# Patient Record
Sex: Male | Born: 1965 | Race: Asian | Hispanic: No | Marital: Married | Smoking: Former smoker
Health system: Southern US, Community
[De-identification: ages and names within clinical notes are randomized; demographics above are authoritative.]

## PROBLEM LIST (undated history)

## (undated) DIAGNOSIS — E7800 Pure hypercholesterolemia, unspecified: Secondary | ICD-10-CM

## (undated) HISTORY — PX: NASAL SEPTUM SURGERY: SHX37

---

## 2007-07-23 ENCOUNTER — Encounter: Admission: RE | Admit: 2007-07-23 | Discharge: 2007-07-23 | Payer: Self-pay | Admitting: Otolaryngology

## 2015-01-08 ENCOUNTER — Institutional Professional Consult (permissible substitution): Payer: Self-pay | Admitting: Internal Medicine

## 2018-01-31 DIAGNOSIS — E7800 Pure hypercholesterolemia, unspecified: Secondary | ICD-10-CM | POA: Diagnosis not present

## 2018-01-31 DIAGNOSIS — R0989 Other specified symptoms and signs involving the circulatory and respiratory systems: Secondary | ICD-10-CM | POA: Diagnosis not present

## 2018-01-31 DIAGNOSIS — R062 Wheezing: Secondary | ICD-10-CM | POA: Diagnosis not present

## 2018-01-31 DIAGNOSIS — R739 Hyperglycemia, unspecified: Secondary | ICD-10-CM | POA: Diagnosis not present

## 2018-02-22 DIAGNOSIS — Z0000 Encounter for general adult medical examination without abnormal findings: Secondary | ICD-10-CM | POA: Diagnosis not present

## 2018-02-22 DIAGNOSIS — Z125 Encounter for screening for malignant neoplasm of prostate: Secondary | ICD-10-CM | POA: Diagnosis not present

## 2018-02-22 DIAGNOSIS — R739 Hyperglycemia, unspecified: Secondary | ICD-10-CM | POA: Diagnosis not present

## 2018-02-22 DIAGNOSIS — E7800 Pure hypercholesterolemia, unspecified: Secondary | ICD-10-CM | POA: Diagnosis not present

## 2018-03-22 DIAGNOSIS — R065 Mouth breathing: Secondary | ICD-10-CM | POA: Diagnosis not present

## 2018-03-22 DIAGNOSIS — R0989 Other specified symptoms and signs involving the circulatory and respiratory systems: Secondary | ICD-10-CM | POA: Diagnosis not present

## 2018-03-22 DIAGNOSIS — J209 Acute bronchitis, unspecified: Secondary | ICD-10-CM | POA: Diagnosis not present

## 2018-03-22 DIAGNOSIS — R0981 Nasal congestion: Secondary | ICD-10-CM | POA: Diagnosis not present

## 2018-03-23 DIAGNOSIS — R0981 Nasal congestion: Secondary | ICD-10-CM | POA: Diagnosis not present

## 2018-05-14 DIAGNOSIS — J3089 Other allergic rhinitis: Secondary | ICD-10-CM | POA: Diagnosis not present

## 2018-05-14 DIAGNOSIS — J3081 Allergic rhinitis due to animal (cat) (dog) hair and dander: Secondary | ICD-10-CM | POA: Diagnosis not present

## 2018-05-14 DIAGNOSIS — J4530 Mild persistent asthma, uncomplicated: Secondary | ICD-10-CM | POA: Diagnosis not present

## 2018-05-14 DIAGNOSIS — J301 Allergic rhinitis due to pollen: Secondary | ICD-10-CM | POA: Diagnosis not present

## 2018-05-20 DIAGNOSIS — J301 Allergic rhinitis due to pollen: Secondary | ICD-10-CM | POA: Diagnosis not present

## 2018-05-21 DIAGNOSIS — J3089 Other allergic rhinitis: Secondary | ICD-10-CM | POA: Diagnosis not present

## 2018-05-21 DIAGNOSIS — J3081 Allergic rhinitis due to animal (cat) (dog) hair and dander: Secondary | ICD-10-CM | POA: Diagnosis not present

## 2018-05-23 DIAGNOSIS — J343 Hypertrophy of nasal turbinates: Secondary | ICD-10-CM | POA: Diagnosis not present

## 2018-05-23 DIAGNOSIS — Z7289 Other problems related to lifestyle: Secondary | ICD-10-CM | POA: Diagnosis not present

## 2018-05-23 DIAGNOSIS — J342 Deviated nasal septum: Secondary | ICD-10-CM | POA: Diagnosis not present

## 2018-05-23 DIAGNOSIS — J309 Allergic rhinitis, unspecified: Secondary | ICD-10-CM | POA: Diagnosis not present

## 2018-05-28 DIAGNOSIS — J3089 Other allergic rhinitis: Secondary | ICD-10-CM | POA: Diagnosis not present

## 2018-05-28 DIAGNOSIS — J301 Allergic rhinitis due to pollen: Secondary | ICD-10-CM | POA: Diagnosis not present

## 2018-05-28 DIAGNOSIS — J3081 Allergic rhinitis due to animal (cat) (dog) hair and dander: Secondary | ICD-10-CM | POA: Diagnosis not present

## 2018-05-30 DIAGNOSIS — J3081 Allergic rhinitis due to animal (cat) (dog) hair and dander: Secondary | ICD-10-CM | POA: Diagnosis not present

## 2018-05-30 DIAGNOSIS — J3089 Other allergic rhinitis: Secondary | ICD-10-CM | POA: Diagnosis not present

## 2018-05-30 DIAGNOSIS — J301 Allergic rhinitis due to pollen: Secondary | ICD-10-CM | POA: Diagnosis not present

## 2018-06-03 DIAGNOSIS — J301 Allergic rhinitis due to pollen: Secondary | ICD-10-CM | POA: Diagnosis not present

## 2018-06-03 DIAGNOSIS — J3081 Allergic rhinitis due to animal (cat) (dog) hair and dander: Secondary | ICD-10-CM | POA: Diagnosis not present

## 2018-06-03 DIAGNOSIS — J3089 Other allergic rhinitis: Secondary | ICD-10-CM | POA: Diagnosis not present

## 2018-06-05 DIAGNOSIS — J301 Allergic rhinitis due to pollen: Secondary | ICD-10-CM | POA: Diagnosis not present

## 2018-06-05 DIAGNOSIS — J3089 Other allergic rhinitis: Secondary | ICD-10-CM | POA: Diagnosis not present

## 2018-06-05 DIAGNOSIS — J3081 Allergic rhinitis due to animal (cat) (dog) hair and dander: Secondary | ICD-10-CM | POA: Diagnosis not present

## 2018-06-11 DIAGNOSIS — J3089 Other allergic rhinitis: Secondary | ICD-10-CM | POA: Diagnosis not present

## 2018-06-11 DIAGNOSIS — J301 Allergic rhinitis due to pollen: Secondary | ICD-10-CM | POA: Diagnosis not present

## 2018-06-11 DIAGNOSIS — J3081 Allergic rhinitis due to animal (cat) (dog) hair and dander: Secondary | ICD-10-CM | POA: Diagnosis not present

## 2018-06-13 DIAGNOSIS — J3089 Other allergic rhinitis: Secondary | ICD-10-CM | POA: Diagnosis not present

## 2018-06-13 DIAGNOSIS — J3081 Allergic rhinitis due to animal (cat) (dog) hair and dander: Secondary | ICD-10-CM | POA: Diagnosis not present

## 2018-06-13 DIAGNOSIS — J301 Allergic rhinitis due to pollen: Secondary | ICD-10-CM | POA: Diagnosis not present

## 2018-06-18 DIAGNOSIS — J3081 Allergic rhinitis due to animal (cat) (dog) hair and dander: Secondary | ICD-10-CM | POA: Diagnosis not present

## 2018-06-18 DIAGNOSIS — J301 Allergic rhinitis due to pollen: Secondary | ICD-10-CM | POA: Diagnosis not present

## 2018-06-18 DIAGNOSIS — J3089 Other allergic rhinitis: Secondary | ICD-10-CM | POA: Diagnosis not present

## 2018-06-20 DIAGNOSIS — J3089 Other allergic rhinitis: Secondary | ICD-10-CM | POA: Diagnosis not present

## 2018-06-20 DIAGNOSIS — J301 Allergic rhinitis due to pollen: Secondary | ICD-10-CM | POA: Diagnosis not present

## 2018-06-20 DIAGNOSIS — J3081 Allergic rhinitis due to animal (cat) (dog) hair and dander: Secondary | ICD-10-CM | POA: Diagnosis not present

## 2018-06-24 DIAGNOSIS — J301 Allergic rhinitis due to pollen: Secondary | ICD-10-CM | POA: Diagnosis not present

## 2018-06-24 DIAGNOSIS — J3081 Allergic rhinitis due to animal (cat) (dog) hair and dander: Secondary | ICD-10-CM | POA: Diagnosis not present

## 2018-06-24 DIAGNOSIS — J3089 Other allergic rhinitis: Secondary | ICD-10-CM | POA: Diagnosis not present

## 2018-06-28 DIAGNOSIS — J3081 Allergic rhinitis due to animal (cat) (dog) hair and dander: Secondary | ICD-10-CM | POA: Diagnosis not present

## 2018-06-28 DIAGNOSIS — J3089 Other allergic rhinitis: Secondary | ICD-10-CM | POA: Diagnosis not present

## 2018-06-28 DIAGNOSIS — J301 Allergic rhinitis due to pollen: Secondary | ICD-10-CM | POA: Diagnosis not present

## 2018-07-02 DIAGNOSIS — J301 Allergic rhinitis due to pollen: Secondary | ICD-10-CM | POA: Diagnosis not present

## 2018-07-02 DIAGNOSIS — J3081 Allergic rhinitis due to animal (cat) (dog) hair and dander: Secondary | ICD-10-CM | POA: Diagnosis not present

## 2018-07-05 DIAGNOSIS — J3089 Other allergic rhinitis: Secondary | ICD-10-CM | POA: Diagnosis not present

## 2018-07-05 DIAGNOSIS — J301 Allergic rhinitis due to pollen: Secondary | ICD-10-CM | POA: Diagnosis not present

## 2018-07-05 DIAGNOSIS — J3081 Allergic rhinitis due to animal (cat) (dog) hair and dander: Secondary | ICD-10-CM | POA: Diagnosis not present

## 2018-07-08 DIAGNOSIS — J3089 Other allergic rhinitis: Secondary | ICD-10-CM | POA: Diagnosis not present

## 2018-07-08 DIAGNOSIS — J3081 Allergic rhinitis due to animal (cat) (dog) hair and dander: Secondary | ICD-10-CM | POA: Diagnosis not present

## 2018-07-08 DIAGNOSIS — J301 Allergic rhinitis due to pollen: Secondary | ICD-10-CM | POA: Diagnosis not present

## 2018-07-11 DIAGNOSIS — J3081 Allergic rhinitis due to animal (cat) (dog) hair and dander: Secondary | ICD-10-CM | POA: Diagnosis not present

## 2018-07-11 DIAGNOSIS — J301 Allergic rhinitis due to pollen: Secondary | ICD-10-CM | POA: Diagnosis not present

## 2018-07-11 DIAGNOSIS — J3089 Other allergic rhinitis: Secondary | ICD-10-CM | POA: Diagnosis not present

## 2018-07-15 DIAGNOSIS — J3081 Allergic rhinitis due to animal (cat) (dog) hair and dander: Secondary | ICD-10-CM | POA: Diagnosis not present

## 2018-07-15 DIAGNOSIS — J301 Allergic rhinitis due to pollen: Secondary | ICD-10-CM | POA: Diagnosis not present

## 2018-07-15 DIAGNOSIS — J3089 Other allergic rhinitis: Secondary | ICD-10-CM | POA: Diagnosis not present

## 2018-07-18 DIAGNOSIS — J3081 Allergic rhinitis due to animal (cat) (dog) hair and dander: Secondary | ICD-10-CM | POA: Diagnosis not present

## 2018-07-18 DIAGNOSIS — J3089 Other allergic rhinitis: Secondary | ICD-10-CM | POA: Diagnosis not present

## 2018-07-18 DIAGNOSIS — J301 Allergic rhinitis due to pollen: Secondary | ICD-10-CM | POA: Diagnosis not present

## 2018-07-22 DIAGNOSIS — J3089 Other allergic rhinitis: Secondary | ICD-10-CM | POA: Diagnosis not present

## 2018-07-22 DIAGNOSIS — J301 Allergic rhinitis due to pollen: Secondary | ICD-10-CM | POA: Diagnosis not present

## 2018-07-22 DIAGNOSIS — J3081 Allergic rhinitis due to animal (cat) (dog) hair and dander: Secondary | ICD-10-CM | POA: Diagnosis not present

## 2018-07-31 DIAGNOSIS — J301 Allergic rhinitis due to pollen: Secondary | ICD-10-CM | POA: Diagnosis not present

## 2018-07-31 DIAGNOSIS — J3089 Other allergic rhinitis: Secondary | ICD-10-CM | POA: Diagnosis not present

## 2018-07-31 DIAGNOSIS — J3081 Allergic rhinitis due to animal (cat) (dog) hair and dander: Secondary | ICD-10-CM | POA: Diagnosis not present

## 2018-08-08 DIAGNOSIS — Z1159 Encounter for screening for other viral diseases: Secondary | ICD-10-CM | POA: Diagnosis not present

## 2018-08-12 DIAGNOSIS — J342 Deviated nasal septum: Secondary | ICD-10-CM | POA: Diagnosis not present

## 2018-08-12 DIAGNOSIS — J343 Hypertrophy of nasal turbinates: Secondary | ICD-10-CM | POA: Diagnosis not present

## 2018-08-28 DIAGNOSIS — J3081 Allergic rhinitis due to animal (cat) (dog) hair and dander: Secondary | ICD-10-CM | POA: Diagnosis not present

## 2018-08-28 DIAGNOSIS — J301 Allergic rhinitis due to pollen: Secondary | ICD-10-CM | POA: Diagnosis not present

## 2018-08-28 DIAGNOSIS — J3089 Other allergic rhinitis: Secondary | ICD-10-CM | POA: Diagnosis not present

## 2018-08-30 DIAGNOSIS — J301 Allergic rhinitis due to pollen: Secondary | ICD-10-CM | POA: Diagnosis not present

## 2018-08-30 DIAGNOSIS — J3081 Allergic rhinitis due to animal (cat) (dog) hair and dander: Secondary | ICD-10-CM | POA: Diagnosis not present

## 2018-08-30 DIAGNOSIS — J3089 Other allergic rhinitis: Secondary | ICD-10-CM | POA: Diagnosis not present

## 2018-09-03 DIAGNOSIS — J3089 Other allergic rhinitis: Secondary | ICD-10-CM | POA: Diagnosis not present

## 2018-09-03 DIAGNOSIS — J3081 Allergic rhinitis due to animal (cat) (dog) hair and dander: Secondary | ICD-10-CM | POA: Diagnosis not present

## 2018-09-03 DIAGNOSIS — J301 Allergic rhinitis due to pollen: Secondary | ICD-10-CM | POA: Diagnosis not present

## 2018-09-05 DIAGNOSIS — J301 Allergic rhinitis due to pollen: Secondary | ICD-10-CM | POA: Diagnosis not present

## 2018-09-05 DIAGNOSIS — J3089 Other allergic rhinitis: Secondary | ICD-10-CM | POA: Diagnosis not present

## 2018-09-05 DIAGNOSIS — J3081 Allergic rhinitis due to animal (cat) (dog) hair and dander: Secondary | ICD-10-CM | POA: Diagnosis not present

## 2018-09-10 DIAGNOSIS — J3081 Allergic rhinitis due to animal (cat) (dog) hair and dander: Secondary | ICD-10-CM | POA: Diagnosis not present

## 2018-09-10 DIAGNOSIS — J3089 Other allergic rhinitis: Secondary | ICD-10-CM | POA: Diagnosis not present

## 2018-09-10 DIAGNOSIS — J301 Allergic rhinitis due to pollen: Secondary | ICD-10-CM | POA: Diagnosis not present

## 2018-09-12 DIAGNOSIS — J3089 Other allergic rhinitis: Secondary | ICD-10-CM | POA: Diagnosis not present

## 2018-09-12 DIAGNOSIS — J3081 Allergic rhinitis due to animal (cat) (dog) hair and dander: Secondary | ICD-10-CM | POA: Diagnosis not present

## 2018-09-12 DIAGNOSIS — J301 Allergic rhinitis due to pollen: Secondary | ICD-10-CM | POA: Diagnosis not present

## 2018-09-17 DIAGNOSIS — J3081 Allergic rhinitis due to animal (cat) (dog) hair and dander: Secondary | ICD-10-CM | POA: Diagnosis not present

## 2018-09-17 DIAGNOSIS — J3089 Other allergic rhinitis: Secondary | ICD-10-CM | POA: Diagnosis not present

## 2018-09-17 DIAGNOSIS — J301 Allergic rhinitis due to pollen: Secondary | ICD-10-CM | POA: Diagnosis not present

## 2018-09-20 DIAGNOSIS — J3089 Other allergic rhinitis: Secondary | ICD-10-CM | POA: Diagnosis not present

## 2018-09-20 DIAGNOSIS — J301 Allergic rhinitis due to pollen: Secondary | ICD-10-CM | POA: Diagnosis not present

## 2018-09-20 DIAGNOSIS — J3081 Allergic rhinitis due to animal (cat) (dog) hair and dander: Secondary | ICD-10-CM | POA: Diagnosis not present

## 2018-09-24 DIAGNOSIS — J3089 Other allergic rhinitis: Secondary | ICD-10-CM | POA: Diagnosis not present

## 2018-09-24 DIAGNOSIS — J301 Allergic rhinitis due to pollen: Secondary | ICD-10-CM | POA: Diagnosis not present

## 2018-10-01 DIAGNOSIS — J3081 Allergic rhinitis due to animal (cat) (dog) hair and dander: Secondary | ICD-10-CM | POA: Diagnosis not present

## 2018-10-01 DIAGNOSIS — J3089 Other allergic rhinitis: Secondary | ICD-10-CM | POA: Diagnosis not present

## 2018-10-01 DIAGNOSIS — J301 Allergic rhinitis due to pollen: Secondary | ICD-10-CM | POA: Diagnosis not present

## 2018-10-10 DIAGNOSIS — J301 Allergic rhinitis due to pollen: Secondary | ICD-10-CM | POA: Diagnosis not present

## 2018-10-10 DIAGNOSIS — J3081 Allergic rhinitis due to animal (cat) (dog) hair and dander: Secondary | ICD-10-CM | POA: Diagnosis not present

## 2018-10-10 DIAGNOSIS — J3089 Other allergic rhinitis: Secondary | ICD-10-CM | POA: Diagnosis not present

## 2018-10-15 DIAGNOSIS — J301 Allergic rhinitis due to pollen: Secondary | ICD-10-CM | POA: Diagnosis not present

## 2018-10-15 DIAGNOSIS — J3089 Other allergic rhinitis: Secondary | ICD-10-CM | POA: Diagnosis not present

## 2018-10-15 DIAGNOSIS — J3081 Allergic rhinitis due to animal (cat) (dog) hair and dander: Secondary | ICD-10-CM | POA: Diagnosis not present

## 2018-10-17 DIAGNOSIS — J301 Allergic rhinitis due to pollen: Secondary | ICD-10-CM | POA: Diagnosis not present

## 2018-10-18 DIAGNOSIS — J3089 Other allergic rhinitis: Secondary | ICD-10-CM | POA: Diagnosis not present

## 2018-10-18 DIAGNOSIS — J3081 Allergic rhinitis due to animal (cat) (dog) hair and dander: Secondary | ICD-10-CM | POA: Diagnosis not present

## 2018-11-06 DIAGNOSIS — J3089 Other allergic rhinitis: Secondary | ICD-10-CM | POA: Diagnosis not present

## 2018-11-06 DIAGNOSIS — J3081 Allergic rhinitis due to animal (cat) (dog) hair and dander: Secondary | ICD-10-CM | POA: Diagnosis not present

## 2018-11-06 DIAGNOSIS — J301 Allergic rhinitis due to pollen: Secondary | ICD-10-CM | POA: Diagnosis not present

## 2018-11-11 DIAGNOSIS — J3081 Allergic rhinitis due to animal (cat) (dog) hair and dander: Secondary | ICD-10-CM | POA: Diagnosis not present

## 2018-11-11 DIAGNOSIS — J3089 Other allergic rhinitis: Secondary | ICD-10-CM | POA: Diagnosis not present

## 2018-11-11 DIAGNOSIS — J301 Allergic rhinitis due to pollen: Secondary | ICD-10-CM | POA: Diagnosis not present

## 2018-11-12 DIAGNOSIS — J3089 Other allergic rhinitis: Secondary | ICD-10-CM | POA: Diagnosis not present

## 2018-11-12 DIAGNOSIS — J4530 Mild persistent asthma, uncomplicated: Secondary | ICD-10-CM | POA: Diagnosis not present

## 2018-11-12 DIAGNOSIS — J301 Allergic rhinitis due to pollen: Secondary | ICD-10-CM | POA: Diagnosis not present

## 2018-11-12 DIAGNOSIS — J3081 Allergic rhinitis due to animal (cat) (dog) hair and dander: Secondary | ICD-10-CM | POA: Diagnosis not present

## 2018-11-18 DIAGNOSIS — J3089 Other allergic rhinitis: Secondary | ICD-10-CM | POA: Diagnosis not present

## 2018-11-18 DIAGNOSIS — J301 Allergic rhinitis due to pollen: Secondary | ICD-10-CM | POA: Diagnosis not present

## 2018-11-18 DIAGNOSIS — J3081 Allergic rhinitis due to animal (cat) (dog) hair and dander: Secondary | ICD-10-CM | POA: Diagnosis not present

## 2019-02-08 DIAGNOSIS — Z20828 Contact with and (suspected) exposure to other viral communicable diseases: Secondary | ICD-10-CM | POA: Diagnosis not present

## 2019-03-07 DIAGNOSIS — Z20828 Contact with and (suspected) exposure to other viral communicable diseases: Secondary | ICD-10-CM | POA: Diagnosis not present

## 2019-03-07 DIAGNOSIS — Z03818 Encounter for observation for suspected exposure to other biological agents ruled out: Secondary | ICD-10-CM | POA: Diagnosis not present

## 2019-04-22 DIAGNOSIS — R739 Hyperglycemia, unspecified: Secondary | ICD-10-CM | POA: Diagnosis not present

## 2019-04-22 DIAGNOSIS — E7800 Pure hypercholesterolemia, unspecified: Secondary | ICD-10-CM | POA: Diagnosis not present

## 2019-04-22 DIAGNOSIS — Z8679 Personal history of other diseases of the circulatory system: Secondary | ICD-10-CM | POA: Diagnosis not present

## 2019-05-05 DIAGNOSIS — E7800 Pure hypercholesterolemia, unspecified: Secondary | ICD-10-CM | POA: Diagnosis not present

## 2019-05-05 DIAGNOSIS — R739 Hyperglycemia, unspecified: Secondary | ICD-10-CM | POA: Diagnosis not present

## 2019-05-05 DIAGNOSIS — Z125 Encounter for screening for malignant neoplasm of prostate: Secondary | ICD-10-CM | POA: Diagnosis not present

## 2019-05-12 DIAGNOSIS — Z1211 Encounter for screening for malignant neoplasm of colon: Secondary | ICD-10-CM | POA: Diagnosis not present

## 2019-07-22 DIAGNOSIS — R05 Cough: Secondary | ICD-10-CM | POA: Diagnosis not present

## 2019-07-22 DIAGNOSIS — J329 Chronic sinusitis, unspecified: Secondary | ICD-10-CM | POA: Diagnosis not present

## 2019-07-22 DIAGNOSIS — R062 Wheezing: Secondary | ICD-10-CM | POA: Diagnosis not present

## 2019-12-18 DIAGNOSIS — Z20822 Contact with and (suspected) exposure to covid-19: Secondary | ICD-10-CM | POA: Diagnosis not present

## 2019-12-19 ENCOUNTER — Emergency Department (HOSPITAL_BASED_OUTPATIENT_CLINIC_OR_DEPARTMENT_OTHER): Payer: BC Managed Care – PPO

## 2019-12-19 ENCOUNTER — Other Ambulatory Visit: Payer: Self-pay

## 2019-12-19 ENCOUNTER — Inpatient Hospital Stay (HOSPITAL_BASED_OUTPATIENT_CLINIC_OR_DEPARTMENT_OTHER)
Admission: EM | Admit: 2019-12-19 | Discharge: 2019-12-22 | DRG: 177 | Disposition: A | Discharge status: Home / Self Care | Payer: BC Managed Care – PPO | Attending: Internal Medicine | Admitting: Internal Medicine

## 2019-12-19 ENCOUNTER — Encounter (HOSPITAL_BASED_OUTPATIENT_CLINIC_OR_DEPARTMENT_OTHER): Payer: Self-pay | Admitting: Emergency Medicine

## 2019-12-19 DIAGNOSIS — E7800 Pure hypercholesterolemia, unspecified: Secondary | ICD-10-CM | POA: Diagnosis not present

## 2019-12-19 DIAGNOSIS — Z79899 Other long term (current) drug therapy: Secondary | ICD-10-CM | POA: Diagnosis not present

## 2019-12-19 DIAGNOSIS — Z87891 Personal history of nicotine dependence: Secondary | ICD-10-CM | POA: Diagnosis not present

## 2019-12-19 DIAGNOSIS — J1282 Pneumonia due to coronavirus disease 2019: Secondary | ICD-10-CM | POA: Diagnosis present

## 2019-12-19 DIAGNOSIS — R079 Chest pain, unspecified: Secondary | ICD-10-CM | POA: Diagnosis not present

## 2019-12-19 DIAGNOSIS — R0902 Hypoxemia: Secondary | ICD-10-CM | POA: Diagnosis not present

## 2019-12-19 DIAGNOSIS — R0602 Shortness of breath: Secondary | ICD-10-CM | POA: Diagnosis not present

## 2019-12-19 DIAGNOSIS — E876 Hypokalemia: Secondary | ICD-10-CM | POA: Diagnosis not present

## 2019-12-19 DIAGNOSIS — E785 Hyperlipidemia, unspecified: Secondary | ICD-10-CM | POA: Diagnosis present

## 2019-12-19 DIAGNOSIS — U071 COVID-19: Principal | ICD-10-CM | POA: Diagnosis present

## 2019-12-19 DIAGNOSIS — J9601 Acute respiratory failure with hypoxia: Secondary | ICD-10-CM | POA: Diagnosis present

## 2019-12-19 DIAGNOSIS — R059 Cough, unspecified: Secondary | ICD-10-CM | POA: Diagnosis not present

## 2019-12-19 DIAGNOSIS — R000 Tachycardia, unspecified: Secondary | ICD-10-CM | POA: Diagnosis not present

## 2019-12-19 DIAGNOSIS — R509 Fever, unspecified: Secondary | ICD-10-CM | POA: Diagnosis not present

## 2019-12-19 HISTORY — DX: Pure hypercholesterolemia, unspecified: E78.00

## 2019-12-19 LAB — CBC WITH DIFFERENTIAL/PLATELET
Abs Immature Granulocytes: 0.03 10*3/uL (ref 0.00–0.07)
Basophils Absolute: 0 10*3/uL (ref 0.0–0.1)
Basophils Relative: 0 %
Eosinophils Absolute: 0 10*3/uL (ref 0.0–0.5)
Eosinophils Relative: 0 %
HCT: 43.5 % (ref 39.0–52.0)
Hemoglobin: 15 g/dL (ref 13.0–17.0)
Immature Granulocytes: 0 %
Lymphocytes Relative: 10 %
Lymphs Abs: 0.7 10*3/uL (ref 0.7–4.0)
MCH: 29.5 pg (ref 26.0–34.0)
MCHC: 34.5 g/dL (ref 30.0–36.0)
MCV: 85.6 fL (ref 80.0–100.0)
Monocytes Absolute: 0.4 10*3/uL (ref 0.1–1.0)
Monocytes Relative: 6 %
Neutro Abs: 5.7 10*3/uL (ref 1.7–7.7)
Neutrophils Relative %: 84 %
Platelets: 193 10*3/uL (ref 150–400)
RBC: 5.08 MIL/uL (ref 4.22–5.81)
RDW: 11.9 % (ref 11.5–15.5)
WBC: 6.8 10*3/uL (ref 4.0–10.5)
nRBC: 0 % (ref 0.0–0.2)

## 2019-12-19 LAB — COMPREHENSIVE METABOLIC PANEL
ALT: 35 U/L (ref 0–44)
AST: 30 U/L (ref 15–41)
Albumin: 3.7 g/dL (ref 3.5–5.0)
Alkaline Phosphatase: 47 U/L (ref 38–126)
Anion gap: 8 (ref 5–15)
BUN: 20 mg/dL (ref 6–20)
CO2: 28 mmol/L (ref 22–32)
Calcium: 8.1 mg/dL — ABNORMAL LOW (ref 8.9–10.3)
Chloride: 98 mmol/L (ref 98–111)
Creatinine, Ser: 1.23 mg/dL (ref 0.61–1.24)
GFR, Estimated: >60 mL/min (ref >60)
Glucose, Bld: 129 mg/dL — ABNORMAL HIGH (ref 70–99)
Potassium: 3.2 mmol/L — ABNORMAL LOW (ref 3.5–5.1)
Sodium: 134 mmol/L — ABNORMAL LOW (ref 135–145)
Total Bilirubin: 0.9 mg/dL (ref 0.3–1.2)
Total Protein: 6.5 g/dL (ref 6.5–8.1)

## 2019-12-19 LAB — URINALYSIS, ROUTINE W REFLEX MICROSCOPIC
Bilirubin Urine: NEGATIVE
Glucose, UA: NEGATIVE mg/dL
Ketones, ur: 15 mg/dL — AB
Leukocytes,Ua: NEGATIVE
Nitrite: NEGATIVE
Protein, ur: 30 mg/dL — AB
Specific Gravity, Urine: 1.025 (ref 1.005–1.030)
pH: 6 (ref 5.0–8.0)

## 2019-12-19 LAB — RESP PANEL BY RT-PCR (FLU A&B, COVID) ARPGX2
Influenza A by PCR: NEGATIVE
Influenza B by PCR: NEGATIVE
SARS Coronavirus 2 by RT PCR: POSITIVE — AB

## 2019-12-19 LAB — FERRITIN: Ferritin: 562 ng/mL — ABNORMAL HIGH (ref 24–336)

## 2019-12-19 LAB — C-REACTIVE PROTEIN: CRP: 9.1 mg/dL — ABNORMAL HIGH (ref <1.0)

## 2019-12-19 LAB — URINALYSIS, MICROSCOPIC (REFLEX)
Squamous Epithelial / LPF: NONE SEEN (ref 0–5)
WBC, UA: NONE SEEN WBC/hpf (ref 0–5)

## 2019-12-19 LAB — LACTATE DEHYDROGENASE: LDH: 258 U/L — ABNORMAL HIGH (ref 98–192)

## 2019-12-19 LAB — FIBRINOGEN: Fibrinogen: 594 mg/dL — ABNORMAL HIGH (ref 210–475)

## 2019-12-19 LAB — LACTIC ACID, PLASMA: Lactic Acid, Venous: 0.8 mmol/L (ref 0.5–1.9)

## 2019-12-19 LAB — PROCALCITONIN: Procalcitonin: 0.1 ng/mL

## 2019-12-19 LAB — D-DIMER, QUANTITATIVE: D-Dimer, Quant: 0.29 ug/mL-FEU (ref 0.00–0.50)

## 2019-12-19 LAB — TRIGLYCERIDES: Triglycerides: 121 mg/dL (ref <150)

## 2019-12-19 MED ORDER — METHYLPREDNISOLONE SODIUM SUCC 40 MG IJ SOLR: 40.0000 mg | Freq: Two times a day (BID) | INTRAMUSCULAR | Status: DC

## 2019-12-19 MED ORDER — ACETAMINOPHEN 500 MG PO TABS
ORAL_TABLET | ORAL | Status: AC
  Administered 2019-12-20: 1000 mg via ORAL
  Filled 2019-12-19: qty 2

## 2019-12-19 MED ORDER — SODIUM CHLORIDE 0.9 % IV SOLN
100.0000 mg | Freq: Once | INTRAVENOUS | Status: AC
  Administered 2019-12-19: 100 mg via INTRAVENOUS
  Filled 2019-12-19: qty 20

## 2019-12-19 MED ORDER — ONDANSETRON HCL 4 MG/2ML IJ SOLN: 4.0000 mg | Freq: Four times a day (QID) | INTRAMUSCULAR | Status: DC | PRN

## 2019-12-19 MED ORDER — GUAIFENESIN-DM 100-10 MG/5ML PO SYRP
10.0000 mL | ORAL_SOLUTION | ORAL | Status: DC | PRN
  Administered 2019-12-20 – 2019-12-22 (×6): 10 mL via ORAL
  Filled 2019-12-19 (×6): qty 10

## 2019-12-19 MED ORDER — ASCORBIC ACID 500 MG PO TABS
500.0000 mg | ORAL_TABLET | Freq: Every day | ORAL | Status: DC
  Administered 2019-12-20 – 2019-12-22 (×3): 500 mg via ORAL
  Filled 2019-12-19 (×3): qty 1

## 2019-12-19 MED ORDER — POTASSIUM CHLORIDE 20 MEQ PO PACK
60.0000 meq | PACK | Freq: Once | ORAL | Status: AC
  Administered 2019-12-19: 60 meq via ORAL
  Filled 2019-12-19: qty 3

## 2019-12-19 MED ORDER — ONDANSETRON HCL 4 MG PO TABS: 4.0000 mg | ORAL_TABLET | Freq: Four times a day (QID) | ORAL | Status: DC | PRN

## 2019-12-19 MED ORDER — IPRATROPIUM-ALBUTEROL 20-100 MCG/ACT IN AERS
1.0000 | INHALATION_SPRAY | Freq: Four times a day (QID) | RESPIRATORY_TRACT | Status: DC
  Administered 2019-12-20 – 2019-12-22 (×8): 1 via RESPIRATORY_TRACT
  Filled 2019-12-19 (×3): qty 4

## 2019-12-19 MED ORDER — ZINC SULFATE 220 (50 ZN) MG PO CAPS
220.0000 mg | ORAL_CAPSULE | Freq: Every day | ORAL | Status: DC
  Administered 2019-12-20 – 2019-12-22 (×3): 220 mg via ORAL
  Filled 2019-12-19 (×3): qty 1

## 2019-12-19 MED ORDER — ACETAMINOPHEN 500 MG PO TABS: 500.0000 mg | ORAL_TABLET | Freq: Four times a day (QID) | ORAL | Status: DC | PRN

## 2019-12-19 MED ORDER — SODIUM CHLORIDE 0.9 % IV SOLN
100.0000 mg | Freq: Every day | INTRAVENOUS | Status: DC
  Administered 2019-12-20 – 2019-12-22 (×3): 100 mg via INTRAVENOUS
  Filled 2019-12-19 (×3): qty 20

## 2019-12-19 MED ORDER — HYDROCOD POLST-CPM POLST ER 10-8 MG/5ML PO SUER
5.0000 mL | Freq: Two times a day (BID) | ORAL | Status: DC | PRN
  Administered 2019-12-20 – 2019-12-22 (×3): 5 mL via ORAL
  Filled 2019-12-19 (×3): qty 5

## 2019-12-19 MED ORDER — ALBUTEROL SULFATE HFA 108 (90 BASE) MCG/ACT IN AERS: 1.0000 | INHALATION_SPRAY | RESPIRATORY_TRACT | Status: DC | PRN

## 2019-12-19 MED ORDER — ENOXAPARIN SODIUM 40 MG/0.4ML ~~LOC~~ SOLN
40.0000 mg | SUBCUTANEOUS | Status: DC
  Administered 2019-12-20 – 2019-12-22 (×3): 40 mg via SUBCUTANEOUS
  Filled 2019-12-19 (×3): qty 0.4

## 2019-12-19 MED ORDER — IOHEXOL 350 MG/ML SOLN
100.0000 mL | Freq: Once | INTRAVENOUS | Status: AC | PRN
  Administered 2019-12-19: 87 mL via INTRAVENOUS

## 2019-12-19 MED ORDER — ROSUVASTATIN CALCIUM 20 MG PO TABS
40.0000 mg | ORAL_TABLET | Freq: Every day | ORAL | Status: DC
  Administered 2019-12-20 – 2019-12-22 (×3): 40 mg via ORAL
  Filled 2019-12-19 (×3): qty 2

## 2019-12-19 MED ORDER — DEXAMETHASONE SODIUM PHOSPHATE 10 MG/ML IJ SOLN
10.0000 mg | Freq: Once | INTRAMUSCULAR | Status: AC
  Administered 2019-12-19: 10 mg via INTRAVENOUS
  Filled 2019-12-19: qty 1

## 2019-12-19 MED ORDER — ACETAMINOPHEN 500 MG PO TABS
1000.0000 mg | ORAL_TABLET | Freq: Four times a day (QID) | ORAL | Status: DC | PRN
  Administered 2019-12-19 – 2019-12-21 (×2): 1000 mg via ORAL
  Filled 2019-12-19 (×2): qty 2

## 2019-12-19 NOTE — ED Triage Notes (Signed)
Pt having covid symptoms sinc 11/27.  Pt states he has some chest tightness.  No acute respiratory distress.

## 2019-12-19 NOTE — ED Notes (Signed)
O2 sat 93-94% on RA while ambulating

## 2019-12-19 NOTE — ED Provider Notes (Addendum)
Blood pressure 102/60, pulse 99, temperature (!) 101.1 F (38.4 C), resp. rate (!) 23, height 5\' 5"  (1.651 m), weight 70.8 kg, SpO2 97 %.  Assuming care from Dr. .  In short, Gary Silva is a 54 y.o. male with a chief complaint of Chest Pain, Shortness of Breath, and URI .  Refer to the original H&P for additional details.  The current plan of care is to f/u on CTA and reassess.  04:52 PM  Patient is COVID positive. CXR is clear. CTA without PE but multifocal PNA noted. Starting antivirals and decadron. Will admit with new O2 requirement.   Discussed patient's case with [facility] to request admission. Patient and family (if present) updated with plan. Care transferred to [facility] service.  I reviewed all nursing notes, vitals, pertinent old records, EKGs, labs, imaging (as available).    [facility], MD 12/19/19 1756    14/03/21, MD 12/19/19 [phone]

## 2019-12-19 NOTE — ED Provider Notes (Addendum)
[facility] EMERGENCY DEPARTMENT Provider Note   CSN: [id number] Arrival date & time: 12/19/19  1030     History Chief Complaint  Patient presents with  . Chest Pain  . Shortness of Breath  . URI    Gary Silva is a 54 y.o. male.  Patient with onset of Covid-like symptoms on November 27.  Patient with body aches cough chills fever and now having shortness of breath.  Patient has not had any the Covid vaccines.  Patient has not had any formal Covid testing recently.        Past Medical History:  Diagnosis Date  . Hypercholesteremia     There are no problems to display for this patient.   History reviewed. No pertinent surgical history.     No family history on file.  Social History   Tobacco Use  . Smoking status: Not on file  Substance Use Topics  . Alcohol use: Not on file  . Drug use: Not on file    Home Medications Prior to Admission medications   Not on File    Allergies    Patient has no allergy information on record.  Review of Systems   Review of Systems  Constitutional: Positive for chills and fever.  HENT: Negative for congestion, rhinorrhea and sore throat.   Eyes: Negative for visual disturbance.  Respiratory: Positive for cough and shortness of breath.   Cardiovascular: Negative for chest pain and leg swelling.  Gastrointestinal: Negative for abdominal pain, diarrhea, nausea and vomiting.  Genitourinary: Negative for dysuria.  Musculoskeletal: Positive for myalgias. Negative for back pain and neck pain.  Skin: Negative for rash.  Neurological: Negative for dizziness, light-headedness and headaches.  Hematological: Does not bruise/bleed easily.  Psychiatric/Behavioral: Negative for confusion.    Physical Exam Updated Vital Signs BP 102/60   Pulse 99   Temp (!) 101.1 F (38.4 C)   Resp (!) 23   Ht 1.651 m (5\' 5" )   Wt 70.8 kg   SpO2 97%   BMI 25.96 kg/m   Physical Exam Vitals and nursing note reviewed.    Constitutional:      Appearance: Normal appearance. He is well-developed.  HENT:     Head: Normocephalic and atraumatic.  Eyes:     Extraocular Movements: Extraocular movements intact.     Conjunctiva/sclera: Conjunctivae normal.     Pupils: Pupils are equal, round, and reactive to light.  Cardiovascular:     Rate and Rhythm: Normal rate and regular rhythm.     Heart sounds: No murmur heard.   Pulmonary:     Effort: Pulmonary effort is normal. No respiratory distress.     Breath sounds: Normal breath sounds. No wheezing.  Abdominal:     Palpations: Abdomen is soft.     Tenderness: There is no abdominal tenderness.  Musculoskeletal:        General: Normal range of motion.     Cervical back: Normal range of motion and neck supple.  Skin:    General: Skin is warm and dry.     Capillary Refill: Capillary refill takes less than 2 seconds.  Neurological:     General: No focal deficit present.     Mental Status: He is alert and oriented to person, place, and time.     Cranial Nerves: No cranial nerve deficit.     Sensory: No sensory deficit.     Motor: No weakness.     ED Results / Procedures / Treatments   Labs (  all labs ordered are listed, but only abnormal results are displayed) Labs Reviewed  RESP PANEL BY RT-PCR (FLU A&B, COVID) ARPGX2 - Abnormal; Notable for the following components:      Result Value   SARS Coronavirus 2 by RT PCR POSITIVE (*)    All other components within normal limits  COMPREHENSIVE METABOLIC PANEL - Abnormal; Notable for the following components:   Sodium 134 (*)    Potassium 3.2 (*)    Glucose, Bld 129 (*)    Calcium 8.1 (*)    All other components within normal limits  URINALYSIS, ROUTINE W REFLEX MICROSCOPIC - Abnormal; Notable for the following components:   Hgb urine dipstick TRACE (*)    Ketones, ur 15 (*)    Protein, ur 30 (*)    All other components within normal limits  URINALYSIS, MICROSCOPIC (REFLEX) - Abnormal; Notable for the  following components:   Bacteria, UA MANY (*)    All other components within normal limits  CULTURE, BLOOD (ROUTINE X 2)  CULTURE, BLOOD (ROUTINE X 2)  CBC WITH DIFFERENTIAL/PLATELET  LACTIC ACID, PLASMA  D-DIMER, QUANTITATIVE (NOT AT [facility])  PROCALCITONIN  LACTATE DEHYDROGENASE  FERRITIN  TRIGLYCERIDES  FIBRINOGEN  C-REACTIVE PROTEIN    EKG EKG Interpretation  Date/Time:  Friday December 19 2019 12:42:38 EST Ventricular Rate:  113 PR Interval:    QRS Duration: 78 QT Interval:  303 QTC Calculation: 416 R Axis:   4 Text Interpretation: Sinus tachycardia Anterior infarct, old No previous ECGs available Confirmed by Vanetta Mulders [phone]) on 12/19/2019 1:34:55 PM   Radiology CT Angio Chest PE W/Cm &/Or Wo Cm  Result Date: 12/19/2019 CLINICAL DATA:  Chest pain, cough, fever and positive PCR for COVID-19. EXAM: CT ANGIOGRAPHY CHEST WITH CONTRAST TECHNIQUE: Multidetector CT imaging of the chest was performed using the standard protocol during bolus administration of intravenous contrast. Multiplanar CT image reconstructions and MIPs were obtained to evaluate the vascular anatomy. CONTRAST:  76mL OMNIPAQUE IOHEXOL 350 MG/ML SOLN COMPARISON:  None. FINDINGS: Cardiovascular: Satisfactory opacification of the pulmonary arteries to the segmental level. No evidence of pulmonary embolism. Normal caliber central pulmonary arteries. The thoracic aorta is also well opacified and demonstrates no evidence aneurysmal disease or dissection. There is mild atherosclerotic plaque at the level of the aortic arch and descending thoracic aorta. Normal heart size. No pericardial effusion. Mediastinum/Nodes: No enlarged mediastinal, hilar, or axillary lymph nodes. Thyroid gland, trachea, and esophagus demonstrate no significant findings. Lungs/Pleura: Diffuse pattern of multifocal peripheral ground-glass and interstitial opacities throughout all lobes of both lungs consistent with bilateral COVID-19 pneumonia.  No associated pneumothorax, pulmonary edema or pleural fluid identified. Upper Abdomen: No acute abnormality. Musculoskeletal: No chest wall abnormality. No acute or significant osseous findings. Review of the MIP images confirms the above findings. IMPRESSION: 1. No evidence of pulmonary embolism. 2. Diffuse pattern of multifocal peripheral ground-glass and interstitial opacities throughout all lobes of both lungs consistent with bilateral COVID-19 pneumonia. 3. Mild aortic atherosclerosis. Aortic Atherosclerosis (ICD10-I70.0). Electronically Signed   By: Irish Lack M.D.   On: 12/19/2019 16:42   DG Chest Port 1 View  Result Date: 12/19/2019 CLINICAL DATA:  Chest pain, COVID-19. EXAM: PORTABLE CHEST 1 VIEW COMPARISON:  January 31, 2018. FINDINGS: The heart size and mediastinal contours are within normal limits. Both lungs are clear. No pneumothorax or pleural effusion is noted. The visualized skeletal structures are unremarkable. IMPRESSION: No active disease. Electronically Signed   By: Lupita Raider M.D.   On: 12/19/2019 13:25  Procedures Procedures (including critical care time)  CRITICAL CARE Performed by: Vanetta Mulders Total critical care time: 45 minutes Critical care time was exclusive of separately billable procedures and treating other patients. Critical care was necessary to treat or prevent imminent or life-threatening deterioration. Critical care was time spent personally by me on the following activities: development of treatment plan with patient and/or surrogate as well as nursing, discussions with consultants, evaluation of patient's response to treatment, examination of patient, obtaining history from patient or surrogate, ordering and performing treatments and interventions, ordering and review of laboratory studies, ordering and review of radiographic studies, pulse oximetry and re-evaluation of patient's condition.   Medications Ordered in ED Medications    acetaminophen (TYLENOL) tablet 1,000 mg (1,000 mg Oral Given 12/19/19 1434)  dexamethasone (DECADRON) injection 10 mg (has no administration in time range)  iohexol (OMNIPAQUE) 350 MG/ML injection 100 mL (87 mLs Intravenous Contrast Given 12/19/19 1623)    ED Course  I have reviewed the triage vital signs and the nursing notes.  Pertinent labs & imaging results that were available during my care of the patient were reviewed by me and considered in my medical decision making (see chart for details).    MDM Rules/Calculators/A&P                          Symptoms very consistent with Covid-like illness.  Chest x-ray without any significant findings.  Patient here with an oxygen requirement oxygen sats were down to 88% on room air.  On 2 L of oxygen patient is much more comfortable.  CT angio ordered because no good explanation for the hypoxia at this point in time to rule out pulmonary embolus.  Discussed with Dr. Jacqulyn Bath evening physician who will follow up and make arrangements for admission.  Patient's Covid testing is positive.    Final Clinical Impression(s) / ED Diagnoses Final diagnoses:  COVID  Hypoxia    Rx / DC Orders ED Discharge Orders    None       Vanetta Mulders, MD 12/19/19 1650    Vanetta Mulders, MD 12/19/19 1651

## 2019-12-19 NOTE — H&P (Signed)
History and Physical    Gary Silva YBF:[id number] DOB: May 30, 1965 DOA: 12/19/2019  PCP: Ileana Ladd, MD  Patient coming from: [facility]  I have personally briefly reviewed patient's old medical records in [facility]  Chief Complaint: Fevers, chills, dyspnea  HPI: Gary Silva is a 54 y.o. male with medical history significant for hyperlipidemia who presented to med [facility] ED for evaluation of fevers, chills, dyspnea.  Patient states that since 12/13/2019 he has been experiencing symptoms of significant body aches, fevers, chills, diaphoresis, nausea without emesis, nonproductive cough, chest congestion, fatigue, and diarrhea. Over the last day he began to develop shortness of breath therefore presented to med [facility] ED for further evaluation where he was found to be COVID-19 positive.  Patient states he has no chronic medical conditions other than high cholesterol for which he usually takes rosuvastatin 40 mg daily. He says he has not taken this since symptom onset due to frequent nausea. He is a former smoker of 2-3 PPD and quit 20 years ago. He reports occasional alcohol use about once a month. He denies any recreational drug use. He denies any known drug allergies. He is not aware of any medical conditions in his immediate relatives.  ED Course:  Initial vitals showed BP 116/77, pulse 113, RR 20-31, temp 103.2 Fahrenheit, SPO2 97% on 2 L supplemental O2 via [place].  Per EDP documentation SPO2 was down to 88% on room air.  Labs notable for sodium 134, potassium 3.2, bicarb 28, BUN 20, creatinine 1.23, serum glucose 129, LFTs within normal limits, WBC 6.8, hemoglobin 15.0, platelets 193,000, lactic acid 0.8, D-dimer 0.29, procalcitonin 0.10, ferritin 562, fibrinogen 594, CRP 9.1.  SARS-CoV-2 PCR is positive.  Influenza A/B PCR negative.  Blood cultures were obtained and pending.  Portable chest x-ray was negative for active disease.  CTA chest  PE study was negative for evidence of PE.  Diffuse multifocal peripheral groundglass and interstitial opacities throughout all lobes of the lungs bilaterally were seen.  Patient was given IV Decadron 10 mg and started on IV remdesivir.  The hospitalist service was consulted to admit for further evaluation management.  Review of Systems: All systems reviewed and are negative except as documented in history of present illness above.   Past Medical History:  Diagnosis Date  . Hypercholesteremia     History reviewed. No pertinent surgical history.  Social History:  reports that he has quit smoking. He has never used smokeless tobacco. He reports current alcohol use. He reports that he does not use drugs.  Not on File  History reviewed. No pertinent family history.   Prior to Admission medications   Not on File    Physical Exam: Vitals:   12/19/19 1854 12/19/19 1900 12/19/19 2000 12/19/19 2121  BP: 112/63 (!) 119/105 (!) 107/94 111/89  Pulse: (!) 101 100 94 96  Resp: 19   (!) 21  Temp:    99.3 F (37.4 C)  TempSrc:    Oral  SpO2: 99% 97% 99% 100%  Weight:      Height:       Constitutional: Resting supine in bed, appears fatigued but otherwise in NAD, calm, comfortable Eyes: PERRL, lids and conjunctivae normal ENMT: Mucous membranes are moist. Posterior pharynx clear of any exudate or lesions.Normal dentition.  Neck: normal, supple, no masses. Respiratory: Expiratory crackles bilateral upper and lower lung fields. Normal respiratory effort. No accessory muscle use.  Cardiovascular: Regular rate and rhythm, no  murmurs / rubs / gallops. No extremity edema. 2+ pedal pulses. Abdomen: Mild epigastric tenderness, no masses palpated. No hepatosplenomegaly. Bowel sounds positive.  Musculoskeletal: no clubbing / cyanosis. No joint deformity upper and lower extremities. Good ROM, no contractures. Normal muscle tone.  Skin: Diaphoretic, no rashes, lesions, ulcers. No  induration Neurologic: CN 2-12 grossly intact. Sensation intact, Strength 5/5 in all 4.  Psychiatric: Normal judgment and insight. Alert and oriented x 3. Normal mood.    Labs on Admission: I have personally reviewed following labs and imaging studies  CBC: Recent Labs  Lab 12/19/19 1537  WBC 6.8  NEUTROABS 5.7  HGB 15.0  HCT 43.5  MCV 85.6  PLT 193   Basic Metabolic Panel: Recent Labs  Lab 12/19/19 1537  NA 134*  K 3.2*  CL 98  CO2 28  GLUCOSE 129*  BUN 20  CREATININE 1.23  CALCIUM 8.1*   GFR: Estimated Creatinine Clearance: 61.8 mL/min (by C-G formula based on SCr of 1.23 mg/dL). Liver Function Tests: Recent Labs  Lab 12/19/19 1537  AST 30  ALT 35  ALKPHOS 47  BILITOT 0.9  PROT 6.5  ALBUMIN 3.7   No results for input(s): LIPASE, AMYLASE in the last 168 hours. No results for input(s): AMMONIA in the last 168 hours. Coagulation Profile: No results for input(s): INR, PROTIME in the last 168 hours. Cardiac Enzymes: No results for input(s): CKTOTAL, CKMB, CKMBINDEX, TROPONINI in the last 168 hours. BNP (last 3 results) No results for input(s): PROBNP in the last 8760 hours. HbA1C: No results for input(s): HGBA1C in the last 72 hours. CBG: No results for input(s): GLUCAP in the last 168 hours. Lipid Profile: Recent Labs    12/19/19 1711  TRIG 121   Thyroid Function Tests: No results for input(s): TSH, T4TOTAL, FREET4, T3FREE, THYROIDAB in the last 72 hours. Anemia Panel: Recent Labs    12/19/19 1711  FERRITIN 562*   Urine analysis:    Component Value Date/Time   COLORURINE YELLOW 12/19/2019 1537   APPEARANCEUR CLEAR 12/19/2019 1537   LABSPEC 1.025 12/19/2019 1537   PHURINE 6.0 12/19/2019 1537   GLUCOSEU NEGATIVE 12/19/2019 1537   HGBUR TRACE (A) 12/19/2019 1537   BILIRUBINUR NEGATIVE 12/19/2019 1537   KETONESUR 15 (A) 12/19/2019 1537   PROTEINUR 30 (A) 12/19/2019 1537   NITRITE NEGATIVE 12/19/2019 1537   LEUKOCYTESUR NEGATIVE 12/19/2019  1537    Radiological Exams on Admission: CT Angio Chest PE W/Cm &/Or Wo Cm  Result Date: 12/19/2019 CLINICAL DATA:  Chest pain, cough, fever and positive PCR for COVID-19. EXAM: CT ANGIOGRAPHY CHEST WITH CONTRAST TECHNIQUE: Multidetector CT imaging of the chest was performed using the standard protocol during bolus administration of intravenous contrast. Multiplanar CT image reconstructions and MIPs were obtained to evaluate the vascular anatomy. CONTRAST:  19mL OMNIPAQUE IOHEXOL 350 MG/ML SOLN COMPARISON:  None. FINDINGS: Cardiovascular: Satisfactory opacification of the pulmonary arteries to the segmental level. No evidence of pulmonary embolism. Normal caliber central pulmonary arteries. The thoracic aorta is also well opacified and demonstrates no evidence aneurysmal disease or dissection. There is mild atherosclerotic plaque at the level of the aortic arch and descending thoracic aorta. Normal heart size. No pericardial effusion. Mediastinum/Nodes: No enlarged mediastinal, hilar, or axillary lymph nodes. Thyroid gland, trachea, and esophagus demonstrate no significant findings. Lungs/Pleura: Diffuse pattern of multifocal peripheral ground-glass and interstitial opacities throughout all lobes of both lungs consistent with bilateral COVID-19 pneumonia. No associated pneumothorax, pulmonary edema or pleural fluid identified. Upper Abdomen: No acute abnormality.  Musculoskeletal: No chest wall abnormality. No acute or significant osseous findings. Review of the MIP images confirms the above findings. IMPRESSION: 1. No evidence of pulmonary embolism. 2. Diffuse pattern of multifocal peripheral ground-glass and interstitial opacities throughout all lobes of both lungs consistent with bilateral COVID-19 pneumonia. 3. Mild aortic atherosclerosis. Aortic Atherosclerosis (ICD10-I70.0). Electronically Signed   By: Irish Lack M.D.   On: 12/19/2019 16:42   DG Chest Port 1 View  Result Date: 12/19/2019 CLINICAL  DATA:  Chest pain, COVID-19. EXAM: PORTABLE CHEST 1 VIEW COMPARISON:  January 31, 2018. FINDINGS: The heart size and mediastinal contours are within normal limits. Both lungs are clear. No pneumothorax or pleural effusion is noted. The visualized skeletal structures are unremarkable. IMPRESSION: No active disease. Electronically Signed   By: Lupita Raider M.D.   On: 12/19/2019 13:25    EKG: Personally reviewed. Sinus tachycardia, rate 113. No prior for comparison.  Assessment/Plan Principal Problem:   Pneumonia due to COVID-19 virus Active Problems:   Hypercholesteremia   Hypokalemia  Oval Cavazos is a 54 y.o. male with medical history significant for hyperlipidemia who is admitted with pneumonia due to COVID-19 viral infection.  Pneumonia due to COVID-19 viral infection: Symptoms since 12/13/2019. SARS-CoV-2 positive 12/19/19. CTA negative for PE but shows diffuse multifocal peripheral groundglass interstitial opacities throughout all lobes of the lungs bilaterally. -Continue IV remdesivir -IV Solu-Medrol 40 mg twice daily -Schedule Combivent with as needed albuterol -Supplemental O2 via [place] as needed -Antitussives, flutter valve, incentive spirometer  Hypokalemia: Replete and recheck in a.m.  Hyperlipidemia: Resume home rosuvastatin tomorrow.  DVT prophylaxis: Lovenox Code Status: Full code, confirmed with patient Family Communication: Discussed with patient, he has discussed with family Disposition Plan: From home and likely discharge to home pending symptomatic improvement without further supplemental O2 needs Consults called: None Admission status:  Status is: Inpatient  Remains inpatient appropriate because:IV treatments appropriate due to intensity of illness or inability to take PO and Inpatient level of care appropriate due to severity of illness   Dispo: The patient is from: Home              Anticipated d/c is to: Home              Anticipated d/c date is: 2 days               Patient currently is not medically stable to d/c.   Darreld Mclean MD [facility]  If 7PM-7AM, please contact night-coverage [email or web address]  12/19/2019, 10:39 PM

## 2019-12-20 DIAGNOSIS — E7800 Pure hypercholesterolemia, unspecified: Secondary | ICD-10-CM

## 2019-12-20 DIAGNOSIS — E876 Hypokalemia: Secondary | ICD-10-CM

## 2019-12-20 LAB — D-DIMER, QUANTITATIVE: D-Dimer, Quant: 0.36 ug/mL-FEU (ref 0.00–0.50)

## 2019-12-20 LAB — COMPREHENSIVE METABOLIC PANEL
ALT: 42 U/L (ref 0–44)
AST: 36 U/L (ref 15–41)
Albumin: 3.7 g/dL (ref 3.5–5.0)
Alkaline Phosphatase: 56 U/L (ref 38–126)
Anion gap: 10 (ref 5–15)
BUN: 23 mg/dL — ABNORMAL HIGH (ref 6–20)
CO2: 25 mmol/L (ref 22–32)
Calcium: 8.5 mg/dL — ABNORMAL LOW (ref 8.9–10.3)
Chloride: 101 mmol/L (ref 98–111)
Creatinine, Ser: 1.11 mg/dL (ref 0.61–1.24)
GFR, Estimated: >60 mL/min (ref >60)
Glucose, Bld: 164 mg/dL — ABNORMAL HIGH (ref 70–99)
Potassium: 4.1 mmol/L (ref 3.5–5.1)
Sodium: 136 mmol/L (ref 135–145)
Total Bilirubin: 0.9 mg/dL (ref 0.3–1.2)
Total Protein: 6.8 g/dL (ref 6.5–8.1)

## 2019-12-20 LAB — CBC WITH DIFFERENTIAL/PLATELET
Abs Immature Granulocytes: 0.01 10*3/uL (ref 0.00–0.07)
Basophils Absolute: 0 10*3/uL (ref 0.0–0.1)
Basophils Relative: 0 %
Eosinophils Absolute: 0 10*3/uL (ref 0.0–0.5)
Eosinophils Relative: 0 %
HCT: 45.7 % (ref 39.0–52.0)
Hemoglobin: 15.4 g/dL (ref 13.0–17.0)
Immature Granulocytes: 0 %
Lymphocytes Relative: 11 %
Lymphs Abs: 0.5 10*3/uL — ABNORMAL LOW (ref 0.7–4.0)
MCH: 29.5 pg (ref 26.0–34.0)
MCHC: 33.7 g/dL (ref 30.0–36.0)
MCV: 87.5 fL (ref 80.0–100.0)
Monocytes Absolute: 0.3 10*3/uL (ref 0.1–1.0)
Monocytes Relative: 6 %
Neutro Abs: 3.9 10*3/uL (ref 1.7–7.7)
Neutrophils Relative %: 83 %
Platelets: 204 10*3/uL (ref 150–400)
RBC: 5.22 MIL/uL (ref 4.22–5.81)
RDW: 11.9 % (ref 11.5–15.5)
WBC: 4.7 10*3/uL (ref 4.0–10.5)
nRBC: 0 % (ref 0.0–0.2)

## 2019-12-20 LAB — FERRITIN: Ferritin: 631 ng/mL — ABNORMAL HIGH (ref 24–336)

## 2019-12-20 LAB — HIV ANTIBODY (ROUTINE TESTING W REFLEX): HIV Screen 4th Generation wRfx: NONREACTIVE

## 2019-12-20 LAB — MAGNESIUM: Magnesium: 2.5 mg/dL — ABNORMAL HIGH (ref 1.7–2.4)

## 2019-12-20 LAB — C-REACTIVE PROTEIN: CRP: 12 mg/dL — ABNORMAL HIGH (ref <1.0)

## 2019-12-20 LAB — PHOSPHORUS: Phosphorus: 2.1 mg/dL — ABNORMAL LOW (ref 2.5–4.6)

## 2019-12-20 MED ORDER — METHYLPREDNISOLONE SODIUM SUCC 125 MG IJ SOLR
60.0000 mg | Freq: Two times a day (BID) | INTRAMUSCULAR | Status: DC
  Administered 2019-12-20 – 2019-12-21 (×3): 60 mg via INTRAVENOUS
  Filled 2019-12-20 (×3): qty 2

## 2019-12-20 NOTE — Progress Notes (Signed)
PROGRESS NOTE  Jashua Knaak  YFV:[id number] DOB: 1965/03/03 DOA: 12/19/2019 PCP: Ileana Ladd, MD   Brief Narrative: Keevon Henney is a 54 y.o. male with a history of HLD, former tobacco use who presented to [facility] 12/3 with myalgias, fevers, chills, nausea, cough starting 11/27, and recent development of shortness of breath. He was febrile to 103.39F, 88% on room air improved on 2L O2 with positive SARS-CoV-2 PCR. CXR without definite infiltrate, though subsequent CTA chest revealed no PE and diffuse opacities. Remdesivir and steroids were started and the patient was admitted to [facility] for acute respiratory failure due to covid-19 pneumonia.   Assessment & Plan: Principal Problem:   Pneumonia due to COVID-19 virus Active Problems:   Hypercholesteremia   Hypokalemia  Acute hypoxemic respiratory failure due to covid-19 pneumonia: SARS-CoV-2 PCR positive on 12/3, symptoms since 11/27, not vaccinated, hypoxic on 12/3.  - Continue remdesivir.  - Increase solumedrol given elevated and rising CRP. PCT 0.10.  - Level of hypoxia does not currently call for baricitinib use - Encourage OOB, IS, FV, and awake proning if able - Continue airborne, contact precautions for 21 days from positive testing. - Monitor CMP and inflammatory markers - Enoxaparin prophylactic dose.   History of tobacco use: No Dx COPD, no current wheezing.  - Can continue bronchodilators  Hypokalemia: Resolved.  Hyperlipidemia:  - Continue statin  Asymptomatic bacteruria: Noted, no pyuria or hematuria.   DVT prophylaxis: Lovenox Code Status: Full Family Communication: None at bedside Disposition Plan:  Status is: Inpatient  Remains inpatient appropriate because:Inpatient level of care appropriate due to severity of illness  Dispo: The patient is from: Home              Anticipated d/c is to: Home              Anticipated d/c date is: 2 days              Patient currently is not medically stable to  d/c.  Consultants:   None  Procedures:   None  Antimicrobials:  Remdesivir   Subjective: Myalgias were biggest complaint initially and have improved. Cough is moderate, persistent, associated with worsening dyspnea, but improved with antitussives he's receiving. No exertional chest pain, no leg swelling.   Objective: Vitals:   12/19/19 2000 12/19/19 2121 12/20/19 0122 12/20/19 0538  BP: (!) 107/94 111/89 114/73 102/74  Pulse: 94 96 87 83  Resp:  (!) 21 (!) 22 (!) 22  Temp:  99.3 F (37.4 C) 99.4 F (37.4 C) 98.7 F (37.1 C)  TempSrc:  Oral Oral Oral  SpO2: 99% 100% 96% 98%  Weight:      Height:        Intake/Output Summary (Last 24 hours) at 12/20/2019 0819 Last data filed at 12/19/2019 1827 Gross per 24 hour  Intake 200 ml  Output --  Net 200 ml   Filed Weights   12/19/19 1118  Weight: 70.8 kg    Gen: 54 y.o. male in no distress Pulm: Non-labored breathing supplemental oxygen. Bilateral crackles posteriorly. CV: Regular rate and rhythm. No murmur, rub, or gallop. No JVD, no pedal edema. GI: Abdomen soft, non-tender, non-distended, with normoactive bowel sounds. No organomegaly or masses felt. Ext: Warm, no deformities Skin: No rashes, lesions or ulcers on visualized skin Neuro: Alert and oriented. No focal neurological deficits. Psych: Judgement and insight appear normal. Mood & affect appropriate.   Data Reviewed: I have personally reviewed following labs and imaging studies  CBC: Recent  Labs  Lab 12/19/19 1537 12/20/19 0521  WBC 6.8 4.7  NEUTROABS 5.7 3.9  HGB 15.0 15.4  HCT 43.5 45.7  MCV 85.6 87.5  PLT 193 204   Basic Metabolic Panel: Recent Labs  Lab 12/19/19 1537 12/20/19 0521  NA 134* 136  K 3.2* 4.1  CL 98 101  CO2 28 25  GLUCOSE 129* 164*  BUN 20 23*  CREATININE 1.23 1.11  CALCIUM 8.1* 8.5*  MG  --  2.5*  PHOS  --  2.1*   GFR: Estimated Creatinine Clearance: 68.5 mL/min (by C-G formula based on SCr of 1.11 mg/dL). Liver  Function Tests: Recent Labs  Lab 12/19/19 1537 12/20/19 0521  AST 30 36  ALT 35 42  ALKPHOS 47 56  BILITOT 0.9 0.9  PROT 6.5 6.8  ALBUMIN 3.7 3.7   No results for input(s): LIPASE, AMYLASE in the last 168 hours. No results for input(s): AMMONIA in the last 168 hours. Coagulation Profile: No results for input(s): INR, PROTIME in the last 168 hours. Cardiac Enzymes: No results for input(s): CKTOTAL, CKMB, CKMBINDEX, TROPONINI in the last 168 hours. BNP (last 3 results) No results for input(s): PROBNP in the last 8760 hours. HbA1C: No results for input(s): HGBA1C in the last 72 hours. CBG: No results for input(s): GLUCAP in the last 168 hours. Lipid Profile: Recent Labs    12/19/19 1711  TRIG 121   Thyroid Function Tests: No results for input(s): TSH, T4TOTAL, FREET4, T3FREE, THYROIDAB in the last 72 hours. Anemia Panel: Recent Labs    12/19/19 1711 12/20/19 0521  FERRITIN 562* 631*   Urine analysis:    Component Value Date/Time   COLORURINE YELLOW 12/19/2019 1537   APPEARANCEUR CLEAR 12/19/2019 1537   LABSPEC 1.025 12/19/2019 1537   PHURINE 6.0 12/19/2019 1537   GLUCOSEU NEGATIVE 12/19/2019 1537   HGBUR TRACE (A) 12/19/2019 1537   BILIRUBINUR NEGATIVE 12/19/2019 1537   KETONESUR 15 (A) 12/19/2019 1537   PROTEINUR 30 (A) 12/19/2019 1537   NITRITE NEGATIVE 12/19/2019 1537   LEUKOCYTESUR NEGATIVE 12/19/2019 1537   Recent Results (from the past 240 hour(s))  Resp Panel by RT-PCR (Flu A&B, Covid) Nasopharyngeal Swab     Status: Abnormal   Collection Time: 12/19/19  2:27 PM   Specimen: Nasopharyngeal Swab; Nasopharyngeal(NP) swabs in vial transport medium  Result Value Ref Range Status   SARS Coronavirus 2 by RT PCR POSITIVE (A) NEGATIVE Final    Comment: RESULT CALLED TO, READ BACK BY AND VERIFIED WITH: SIMMS MARVA, RN @ 1531 ON 12/19/2019, [email or web address] (NOTE) SARS-CoV-2 target nucleic acids are DETECTED.  The SARS-CoV-2 RNA is generally detectable in upper  respiratory specimens during the acute phase of infection. Positive results are indicative of the presence of the identified virus, but do not rule out bacterial infection or co-infection with other pathogens not detected by the test. Clinical correlation with patient history and other diagnostic information is necessary to determine patient infection status. The expected result is Negative.  Fact Sheet for Patients: BloggerCourse.com  Fact Sheet for Healthcare Providers: SeriousBroker.it  This test is not yet approved or cleared by the [place] FDA and  has been authorized for detection and/or diagnosis of SARS-CoV-2 by FDA under an Emergency Use Authorization (EUA).  This EUA will remain in effect (meaning  this test can be used) for the duration of  the COVID-19 declaration under Section 564(b)(1) of the Act, 21 U.S.C. section 360bbb-3(b)(1), unless the authorization is terminated or revoked sooner.  Influenza A by PCR NEGATIVE NEGATIVE Final   Influenza B by PCR NEGATIVE NEGATIVE Final    Comment: (NOTE) The Xpert Xpress SARS-CoV-2/FLU/RSV plus assay is intended as an aid in the diagnosis of influenza from Nasopharyngeal swab specimens and should not be used as a sole basis for treatment. Nasal washings and aspirates are unacceptable for Xpert Xpress SARS-CoV-2/FLU/RSV testing.  Fact Sheet for Patients: BloggerCourse.comhttps://www.fda.gov/media/152166/download  Fact Sheet for Healthcare Providers: SeriousBroker.ithttps://www.fda.gov/media/152162/download  This test is not yet approved or cleared by the [place] FDA and has been authorized for detection and/or diagnosis of SARS-CoV-2 by FDA under an Emergency Use Authorization (EUA). This EUA will remain in effect (meaning this test can be used) for the duration of the COVID-19 declaration under Section 564(b)(1) of the Act, 21 U.S.C. section 360bbb-3(b)(1), unless the authorization is  terminated or revoked.  Performed at [facility], [address] [place], [place] [address]   Culture, blood (Routine X 2) w Reflex to ID Panel     Status: None (Preliminary result)   Collection Time: 12/19/19  3:35 PM   Specimen: BLOOD LEFT HAND  Result Value Ref Range Status   Specimen Description   Final    BLOOD LEFT HAND Performed at [facility], [address] [place], [place] [address]    Special Requests   Final    BOTTLES DRAWN AEROBIC AND ANAEROBIC Blood Culture adequate volume Performed at [facility] [place], [address] [place], [place] [address]    Culture PENDING  Incomplete   Report Status PENDING  Incomplete      Radiology Studies: CT Angio Chest PE W/Cm &/Or Wo Cm  Result Date: 12/19/2019 CLINICAL DATA:  Chest pain, cough, fever and positive PCR for COVID-19. EXAM: CT ANGIOGRAPHY CHEST WITH CONTRAST TECHNIQUE: Multidetector CT imaging of the chest was performed using the standard protocol during bolus administration of intravenous contrast. Multiplanar CT image reconstructions and MIPs were obtained to evaluate the vascular anatomy. CONTRAST:  87mL OMNIPAQUE IOHEXOL 350 MG/ML SOLN COMPARISON:  None. FINDINGS: Cardiovascular: Satisfactory opacification of the pulmonary arteries to the segmental level. No evidence of pulmonary embolism. Normal caliber central pulmonary arteries. The thoracic aorta is also well opacified and demonstrates no evidence aneurysmal disease or dissection. There is mild atherosclerotic plaque at the level of the aortic arch and descending thoracic aorta. Normal heart size. No pericardial effusion. Mediastinum/Nodes: No enlarged mediastinal, hilar, or axillary lymph nodes. Thyroid gland, trachea, and esophagus demonstrate no significant findings. Lungs/Pleura: Diffuse pattern of multifocal peripheral ground-glass and interstitial opacities throughout all lobes of both lungs consistent with bilateral COVID-19 pneumonia. No  associated pneumothorax, pulmonary edema or pleural fluid identified. Upper Abdomen: No acute abnormality. Musculoskeletal: No chest wall abnormality. No acute or significant osseous findings. Review of the MIP images confirms the above findings. IMPRESSION: 1. No evidence of pulmonary embolism. 2. Diffuse pattern of multifocal peripheral ground-glass and interstitial opacities throughout all lobes of both lungs consistent with bilateral COVID-19 pneumonia. 3. Mild aortic atherosclerosis. Aortic Atherosclerosis (ICD10-I70.0). Electronically Signed   By: Irish LackGlenn  Yamagata M.D.   On: 12/19/2019 16:42   DG Chest Port 1 View  Result Date: 12/19/2019 CLINICAL DATA:  Chest pain, COVID-19. EXAM: PORTABLE CHEST 1 VIEW COMPARISON:  January 31, 2018. FINDINGS: The heart size and mediastinal contours are within normal limits. Both lungs are clear. No pneumothorax or pleural effusion is noted. The visualized skeletal structures are unremarkable. IMPRESSION: No active disease. Electronically Signed   By:  Lupita Raider M.D.   On: 12/19/2019 13:25    Scheduled Meds: . vitamin C  500 mg Oral Daily  . enoxaparin (LOVENOX) injection  40 mg Subcutaneous Q24H  . Ipratropium-Albuterol  1 puff Inhalation Q6H  . methylPREDNISolone (SOLU-MEDROL) injection  40 mg Intravenous BID  . rosuvastatin  40 mg Oral Daily  . zinc sulfate  220 mg Oral Daily   Continuous Infusions: . remdesivir 100 mg in NS 100 mL       LOS: 1 day   Time spent: 35 minutes.  Tyrone Nine, MD [facility] [email or web address] 12/20/2019, 8:19 AM

## 2019-12-21 DIAGNOSIS — J9601 Acute respiratory failure with hypoxia: Secondary | ICD-10-CM

## 2019-12-21 LAB — COMPREHENSIVE METABOLIC PANEL
ALT: 40 U/L (ref 0–44)
AST: 32 U/L (ref 15–41)
Albumin: 3.6 g/dL (ref 3.5–5.0)
Alkaline Phosphatase: 56 U/L (ref 38–126)
Anion gap: 10 (ref 5–15)
BUN: 29 mg/dL — ABNORMAL HIGH (ref 6–20)
CO2: 25 mmol/L (ref 22–32)
Calcium: 8.8 mg/dL — ABNORMAL LOW (ref 8.9–10.3)
Chloride: 104 mmol/L (ref 98–111)
Creatinine, Ser: 1.07 mg/dL (ref 0.61–1.24)
GFR, Estimated: >60 mL/min (ref >60)
Glucose, Bld: 182 mg/dL — ABNORMAL HIGH (ref 70–99)
Potassium: 4.2 mmol/L (ref 3.5–5.1)
Sodium: 139 mmol/L (ref 135–145)
Total Bilirubin: 0.4 mg/dL (ref 0.3–1.2)
Total Protein: 6.5 g/dL (ref 6.5–8.1)

## 2019-12-21 LAB — D-DIMER, QUANTITATIVE: D-Dimer, Quant: 0.28 ug/mL-FEU (ref 0.00–0.50)

## 2019-12-21 LAB — C-REACTIVE PROTEIN: CRP: 6.5 mg/dL — ABNORMAL HIGH (ref <1.0)

## 2019-12-21 MED ORDER — DEXAMETHASONE 4 MG PO TABS
6.0000 mg | ORAL_TABLET | Freq: Every day | ORAL | Status: DC
  Administered 2019-12-22: 6 mg via ORAL
  Filled 2019-12-21 (×2): qty 2

## 2019-12-21 NOTE — Plan of Care (Signed)
  Problem: Education: Goal: Knowledge of risk factors and measures for prevention of condition will improve Outcome: Progressing   Problem: Coping: Goal: Psychosocial and spiritual needs will be supported Outcome: Progressing   Problem: Respiratory: Goal: Will maintain a patent airway Outcome: Progressing Goal: Complications related to the disease process, condition or treatment will be avoided or minimized Outcome: Progressing   Problem: Education: Goal: Knowledge of risk factors and measures for prevention of condition will improve Outcome: Progressing   Problem: Respiratory: Goal: Will maintain a patent airway Outcome: Progressing Goal: Complications related to the disease process, condition or treatment will be avoided or minimized Outcome: Progressing

## 2019-12-21 NOTE — Progress Notes (Signed)
[facility] PROGRESS NOTE    Progress Note  Gary Silva  UUE:[id number] DOB: Sep 14, 1965 DOA: 12/19/2019 PCP: Ileana Ladd, MD     Brief Narrative:   Gary Silva is an 54 y.o. male past medical history of hyperlipidemia presents to med [facility] on 12/19/2019 for myalgias and fever is lately shortness of breath was found to be hypoxic which improved on 2 L of oxygen with SARS-CoV-2 positive PCR, chest x-ray did not show any infiltrate CT angio of the chest showed no PE but bilateral infiltrates he was started on IV remdesivir and steroids.  Assessment/Plan:   Acute hypoxic respiratory failure Pneumonia due to COVID-19 virus Was started on IV remdesivir and steroids. He has been weaned to room air we will transition Solu-Medrol to dexamethasone.  Monitor overnight and see how he does we will probably discharge him tomorrow.  Will need to schedule outpatient remdesivir treatment. Encourage incentive spirometry, flutter valve out of bed to chair.  History of tobacco abuse: Noted no wheezing continue inhalers.  Hypokalemia: Improved with repletion.  Hypercholesteremia Continue statins.  DVT prophylaxis: lovenox Family Communication:none Status is: Inpatient  Remains inpatient appropriate because:Hemodynamically unstable   Dispo: The patient is from: Home              Anticipated d/c is to: Home              Anticipated d/c date is: 1 day              Patient currently is not medically stable to d/c.        Code Status:     Code Status Orders  (From admission, onward)         Start     Ordered   12/19/19 2232  Full code  Continuous        12/19/19 2234        Code Status History    This patient has a current code status but no historical code status.   Advance Care Planning Activity        IV Access:    Peripheral IV   Procedures and diagnostic studies:   CT Angio Chest PE W/Cm &/Or Wo Cm  Result Date: 12/19/2019 CLINICAL DATA:   Chest pain, cough, fever and positive PCR for COVID-19. EXAM: CT ANGIOGRAPHY CHEST WITH CONTRAST TECHNIQUE: Multidetector CT imaging of the chest was performed using the standard protocol during bolus administration of intravenous contrast. Multiplanar CT image reconstructions and MIPs were obtained to evaluate the vascular anatomy. CONTRAST:  25mL OMNIPAQUE IOHEXOL 350 MG/ML SOLN COMPARISON:  None. FINDINGS: Cardiovascular: Satisfactory opacification of the pulmonary arteries to the segmental level. No evidence of pulmonary embolism. Normal caliber central pulmonary arteries. The thoracic aorta is also well opacified and demonstrates no evidence aneurysmal disease or dissection. There is mild atherosclerotic plaque at the level of the aortic arch and descending thoracic aorta. Normal heart size. No pericardial effusion. Mediastinum/Nodes: No enlarged mediastinal, hilar, or axillary lymph nodes. Thyroid gland, trachea, and esophagus demonstrate no significant findings. Lungs/Pleura: Diffuse pattern of multifocal peripheral ground-glass and interstitial opacities throughout all lobes of both lungs consistent with bilateral COVID-19 pneumonia. No associated pneumothorax, pulmonary edema or pleural fluid identified. Upper Abdomen: No acute abnormality. Musculoskeletal: No chest wall abnormality. No acute or significant osseous findings. Review of the MIP images confirms the above findings. IMPRESSION: 1. No evidence of pulmonary embolism. 2. Diffuse pattern of multifocal peripheral ground-glass and interstitial opacities throughout all lobes of both  lungs consistent with bilateral COVID-19 pneumonia. 3. Mild aortic atherosclerosis. Aortic Atherosclerosis (ICD10-I70.0). Electronically Signed   By: Irish LackGlenn  Yamagata M.D.   On: 12/19/2019 16:42   DG Chest Port 1 View  Result Date: 12/19/2019 CLINICAL DATA:  Chest pain, COVID-19. EXAM: PORTABLE CHEST 1 VIEW COMPARISON:  January 31, 2018. FINDINGS: The heart size and  mediastinal contours are within normal limits. Both lungs are clear. No pneumothorax or pleural effusion is noted. The visualized skeletal structures are unremarkable. IMPRESSION: No active disease. Electronically Signed   By: Lupita RaiderJames  Green Jr M.D.   On: 12/19/2019 13:25     Medical Consultants:    None.  Anti-Infectives:   remdesivir  Subjective:    Gary Silva still feels winded with ambulation.  Objective:    Vitals:   12/20/19 2135 12/20/19 2203 12/21/19 0300 12/21/19 0507  BP:  112/79  106/74  Pulse:  86  71  Resp:  18  18  Temp:  99.5 F (37.5 C)  98 F (36.7 C)  TempSrc:  Oral  Oral  SpO2: 92% 93% 91% 95%  Weight:      Height:       SpO2: 95 % O2 Flow Rate (L/min): 2 L/min   Intake/Output Summary (Last 24 hours) at 12/21/2019 0959 Last data filed at 12/21/2019 0900 Gross per 24 hour  Intake 1540 ml  Output --  Net 1540 ml   Filed Weights   12/19/19 1118  Weight: 70.8 kg    Exam: General exam: In no acute distress. Respiratory system: Good air movement and clear to auscultation. Cardiovascular system: S1 & S2 heard, RRR.  Gastrointestinal system: Abdomen is nondistended, soft and nontender.  Extremities: No pedal edema. Skin: No rashes, lesions or ulcers Psychiatry: Judgement and insight appear normal. Mood & affect appropriate.    Data Reviewed:    Labs: Basic Metabolic Panel: Recent Labs  Lab 12/19/19 1537 12/19/19 1537 12/20/19 0521 12/21/19 0455  NA 134*  --  136 139  K 3.2*   < > 4.1 4.2  CL 98  --  101 104  CO2 28  --  25 25  GLUCOSE 129*  --  164* 182*  BUN 20  --  23* 29*  CREATININE 1.23  --  1.11 1.07  CALCIUM 8.1*  --  8.5* 8.8*  MG  --   --  2.5*  --   PHOS  --   --  2.1*  --    < > = values in this interval not displayed.   GFR Estimated Creatinine Clearance: 71 mL/min (by C-G formula based on SCr of 1.07 mg/dL). Liver Function Tests: Recent Labs  Lab 12/19/19 1537 12/20/19 0521 12/21/19 0455  AST 30 36 32   ALT 35 42 40  ALKPHOS 47 56 56  BILITOT 0.9 0.9 0.4  PROT 6.5 6.8 6.5  ALBUMIN 3.7 3.7 3.6   No results for input(s): LIPASE, AMYLASE in the last 168 hours. No results for input(s): AMMONIA in the last 168 hours. Coagulation profile No results for input(s): INR, PROTIME in the last 168 hours. COVID-19 Labs  Recent Labs    12/19/19 1711 12/20/19 0521 12/21/19 0455  DDIMER 0.29 0.36 0.28  FERRITIN 562* 631*  --   LDH 258*  --   --   CRP 9.1* 12.0* 6.5*    Lab Results  Component Value Date   SARSCOV2NAA POSITIVE (A) 12/19/2019    CBC: Recent Labs  Lab 12/19/19 1537 12/20/19 0521  WBC 6.8  4.7  NEUTROABS 5.7 3.9  HGB 15.0 15.4  HCT 43.5 45.7  MCV 85.6 87.5  PLT 193 204   Cardiac Enzymes: No results for input(s): CKTOTAL, CKMB, CKMBINDEX, TROPONINI in the last 168 hours. BNP (last 3 results) No results for input(s): PROBNP in the last 8760 hours. CBG: No results for input(s): GLUCAP in the last 168 hours. D-Dimer: Recent Labs    12/20/19 0521 12/21/19 0455  DDIMER 0.36 0.28   Hgb A1c: No results for input(s): HGBA1C in the last 72 hours. Lipid Profile: Recent Labs    12/19/19 1711  TRIG 121   Thyroid function studies: No results for input(s): TSH, T4TOTAL, T3FREE, THYROIDAB in the last 72 hours.  Invalid input(s): FREET3 Anemia work up: Recent Labs    12/19/19 1711 12/20/19 0521  FERRITIN 562* 631*   Sepsis Labs: Recent Labs  Lab 12/19/19 1537 12/19/19 1711 12/20/19 0521  PROCALCITON  --  0.10  --   WBC 6.8  --  4.7  LATICACIDVEN 0.8  --   --    Microbiology Recent Results (from the past 240 hour(s))  Resp Panel by RT-PCR (Flu A&B, Covid) Nasopharyngeal Swab     Status: Abnormal   Collection Time: 12/19/19  2:27 PM   Specimen: Nasopharyngeal Swab; Nasopharyngeal(NP) swabs in vial transport medium  Result Value Ref Range Status   SARS Coronavirus 2 by RT PCR POSITIVE (A) NEGATIVE Final    Comment: RESULT CALLED TO, READ BACK BY AND  VERIFIED WITH: SIMMS MARVA, RN @ 1531 ON 12/19/2019, [email or web address] (NOTE) SARS-CoV-2 target nucleic acids are DETECTED.  The SARS-CoV-2 RNA is generally detectable in upper respiratory specimens during the acute phase of infection. Positive results are indicative of the presence of the identified virus, but do not rule out bacterial infection or co-infection with other pathogens not detected by the test. Clinical correlation with patient history and other diagnostic information is necessary to determine patient infection status. The expected result is Negative.  Fact Sheet for Patients: BloggerCourse.com  Fact Sheet for Healthcare Providers: SeriousBroker.it  This test is not yet approved or cleared by the [place] FDA and  has been authorized for detection and/or diagnosis of SARS-CoV-2 by FDA under an Emergency Use Authorization (EUA).  This EUA will remain in effect (meaning  this test can be used) for the duration of  the COVID-19 declaration under Section 564(b)(1) of the Act, 21 U.S.C. section 360bbb-3(b)(1), unless the authorization is terminated or revoked sooner.     Influenza A by PCR NEGATIVE NEGATIVE Final   Influenza B by PCR NEGATIVE NEGATIVE Final    Comment: (NOTE) The Xpert Xpress SARS-CoV-2/FLU/RSV plus assay is intended as an aid in the diagnosis of influenza from Nasopharyngeal swab specimens and should not be used as a sole basis for treatment. Nasal washings and aspirates are unacceptable for Xpert Xpress SARS-CoV-2/FLU/RSV testing.  Fact Sheet for Patients: BloggerCourse.com  Fact Sheet for Healthcare Providers: SeriousBroker.it  This test is not yet approved or cleared by the [place] FDA and has been authorized for detection and/or diagnosis of SARS-CoV-2 by FDA under an Emergency Use Authorization (EUA). This EUA will remain in effect  (meaning this test can be used) for the duration of the COVID-19 declaration under Section 564(b)(1) of the Act, 21 U.S.C. section 360bbb-3(b)(1), unless the authorization is terminated or revoked.  Performed at [facility], [address] [place], [place] [address]   Culture, blood (Routine X 2) w Reflex to ID Panel  Status: None (Preliminary result)   Collection Time: 12/19/19  3:30 PM   Specimen: BLOOD  Result Value Ref Range Status   Specimen Description   Final    BLOOD LEFT ANTECUBITAL Performed at [facility], [address] [place], [place] [address]    Special Requests   Final    BOTTLES DRAWN AEROBIC AND ANAEROBIC Blood Culture adequate volume Performed at [facility], [address] [place], [place] [address]    Culture   Final    NO GROWTH 2 DAYS Performed at [facility], [address], [place], [place] [address]    Report Status PENDING  Incomplete  Culture, blood (Routine X 2) w Reflex to ID Panel     Status: None (Preliminary result)   Collection Time: 12/19/19  3:35 PM   Specimen: BLOOD LEFT HAND  Result Value Ref Range Status   Specimen Description   Final    BLOOD LEFT HAND Performed at [facility], [address] [place], [place] [address]    Special Requests   Final    BOTTLES DRAWN AEROBIC AND ANAEROBIC Blood Culture adequate volume Performed at [facility], [address] [place], [place] [address]    Culture   Final    NO GROWTH 2 DAYS Performed at [facility], [address], [place], [place] [address]    Report Status PENDING  Incomplete     Medications:   . vitamin C  500 mg Oral Daily  . enoxaparin (LOVENOX) injection  40 mg Subcutaneous Q24H  . Ipratropium-Albuterol  1 puff Inhalation Q6H  . methylPREDNISolone (SOLU-MEDROL) injection  60 mg Intravenous BID  . rosuvastatin  40 mg Oral Daily  . zinc sulfate  220 mg Oral Daily   Continuous Infusions: .  remdesivir 100 mg in NS 100 mL 100 mg (12/21/19 0955)      LOS: 2 days   Marinda Elk  [facility]  12/21/2019, 9:59 AM

## 2019-12-22 LAB — COMPREHENSIVE METABOLIC PANEL
ALT: 44 U/L (ref 0–44)
AST: 31 U/L (ref 15–41)
Albumin: 3.3 g/dL — ABNORMAL LOW (ref 3.5–5.0)
Alkaline Phosphatase: 48 U/L (ref 38–126)
Anion gap: 10 (ref 5–15)
BUN: 28 mg/dL — ABNORMAL HIGH (ref 6–20)
CO2: 24 mmol/L (ref 22–32)
Calcium: 8.4 mg/dL — ABNORMAL LOW (ref 8.9–10.3)
Chloride: 105 mmol/L (ref 98–111)
Creatinine, Ser: 0.93 mg/dL (ref 0.61–1.24)
GFR, Estimated: >60 mL/min (ref >60)
Glucose, Bld: 149 mg/dL — ABNORMAL HIGH (ref 70–99)
Potassium: 4.2 mmol/L (ref 3.5–5.1)
Sodium: 139 mmol/L (ref 135–145)
Total Bilirubin: 0.5 mg/dL (ref 0.3–1.2)
Total Protein: 5.9 g/dL — ABNORMAL LOW (ref 6.5–8.1)

## 2019-12-22 LAB — CULTURE, BLOOD (ROUTINE X 2): Special Requests: ADEQUATE

## 2019-12-22 LAB — D-DIMER, QUANTITATIVE: D-Dimer, Quant: <0.27 ug/mL-FEU (ref 0.00–0.50)

## 2019-12-22 LAB — C-REACTIVE PROTEIN: CRP: 2.4 mg/dL — ABNORMAL HIGH (ref <1.0)

## 2019-12-22 MED ORDER — DIPHENHYDRAMINE HCL 50 MG/ML IJ SOLN: 50.0000 mg | Freq: Once | INTRAMUSCULAR | Status: DC | PRN

## 2019-12-22 MED ORDER — DEXAMETHASONE 0.5 MG PO TABS: 1.0000 mg | ORAL_TABLET | Freq: Four times a day (QID) | ORAL | 0 refills | Status: AC

## 2019-12-22 MED ORDER — FAMOTIDINE IN NACL 20-0.9 MG/50ML-% IV SOLN: 20.0000 mg | Freq: Once | INTRAVENOUS | Status: DC | PRN

## 2019-12-22 MED ORDER — ALBUTEROL SULFATE HFA 108 (90 BASE) MCG/ACT IN AERS: 2.0000 | INHALATION_SPRAY | Freq: Once | RESPIRATORY_TRACT | Status: DC | PRN

## 2019-12-22 MED ORDER — EPINEPHRINE 0.3 MG/0.3ML IJ SOAJ: 0.3000 mg | Freq: Once | INTRAMUSCULAR | Status: DC | PRN

## 2019-12-22 MED ORDER — METHYLPREDNISOLONE SODIUM SUCC 125 MG IJ SOLR: 125.0000 mg | Freq: Once | INTRAMUSCULAR | Status: DC | PRN

## 2019-12-22 MED ORDER — SODIUM CHLORIDE 0.9 % IV SOLN: INTRAVENOUS | Status: DC | PRN

## 2019-12-22 MED ORDER — SODIUM CHLORIDE 0.9 % IV SOLN: 100.0000 mg | Freq: Once | INTRAVENOUS | Status: DC

## 2019-12-22 NOTE — Plan of Care (Signed)
  Problem: Education: Goal: Knowledge of risk factors and measures for prevention of condition will improve Outcome: Adequate for Discharge   Problem: Coping: Goal: Psychosocial and spiritual needs will be supported Outcome: Adequate for Discharge   Problem: Respiratory: Goal: Will maintain a patent airway Outcome: Adequate for Discharge Goal: Complications related to the disease process, condition or treatment will be avoided or minimized Outcome: Adequate for Discharge   Problem: Education: Goal: Knowledge of risk factors and measures for prevention of condition will improve Outcome: Adequate for Discharge   Problem: Respiratory: Goal: Will maintain a patent airway Outcome: Adequate for Discharge Goal: Complications related to the disease process, condition or treatment will be avoided or minimized Outcome: Adequate for Discharge

## 2019-12-22 NOTE — Discharge Instructions (Signed)
COVID-19 Frequently Asked Questions COVID-19 (coronavirus disease) is an infection that is caused by a large family of viruses. Some viruses cause illness in people and others cause illness in animals like camels, cats, and bats. In some cases, the viruses that cause illness in animals can spread to humans. Where did the coronavirus come from? In December 2019, [place] told the [facility]) of several cases of lung disease (human respiratory illness). These cases were linked to an open seafood and livestock market in the city of [place]. The link to the seafood and livestock market suggests that the virus may have spread from animals to humans. However, since that first outbreak in December, the virus has also been shown to spread from person to person. What is the name of the disease and the virus? Disease name Early on, this disease was called novel coronavirus. This is because scientists determined that the disease was caused by a new (novel) respiratory virus. The World Health Organization [facility]) has now named the disease COVID-19, or coronavirus disease. Virus name The virus that causes the disease is called severe acute respiratory syndrome coronavirus 2 (SARS-CoV-2). More information on disease and virus naming World Health Organization [facility]): www.who.int/emergencies/diseases/novel-coronavirus-2019/technical-guidance/naming-the-coronavirus-disease-(covid-2019)-and-the-virus-that-causes-it Who is at risk for complications from coronavirus disease? Some people may be at higher risk for complications from coronavirus disease. This includes older adults and people who have chronic diseases, such as heart disease, diabetes, and lung disease. If you are at higher risk for complications, take these extra precautions:  Stay home as much as possible.  Avoid social gatherings and travel.  Avoid close contact with others. Stay at least 6 ft (2 m) away from others, if  possible.  Wash your hands often with soap and water for at least 20 seconds.  Avoid touching your face, mouth, nose, or eyes.  Keep supplies on hand at home, such as food, medicine, and cleaning supplies.  If you must go out in public, wear a cloth face covering or face mask. Make sure your mask covers your nose and mouth. How does coronavirus disease spread? The virus that causes coronavirus disease spreads easily from person to person (is contagious). You may catch the virus by:  Breathing in droplets from an infected person. Droplets can be spread by a person breathing, speaking, singing, coughing, or sneezing.  Touching something, like a table or a doorknob, that was exposed to the virus (contaminated) and then touching your mouth, nose, or eyes. Can I get the virus from touching surfaces or objects? There is still a lot that we do not know about the virus that causes coronavirus disease. Scientists are basing a lot of information on what they know about similar viruses, such as:  Viruses cannot generally survive on surfaces for long. They need a human body (host) to survive.  It is more likely that the virus is spread by close contact with people who are sick (direct contact), such as through: ? Shaking hands or hugging. ? Breathing in respiratory droplets that travel through the air. Droplets can be spread by a person breathing, speaking, singing, coughing, or sneezing.  It is less likely that the virus is spread when a person touches a surface or object that has the virus on it (indirect contact). The virus may be able to enter the body if the person touches a surface or object and then touches his or her face, eyes, nose, or mouth. Can a person spread the virus without having symptoms of the  disease? It may be possible for the virus to spread before a person has symptoms of the disease, but this is most likely not the main way the virus is spreading. It is more likely for the virus  to spread by being in close contact with people who are sick and breathing in the respiratory droplets spread by a person breathing, speaking, singing, coughing, or sneezing. What are the symptoms of coronavirus disease? Symptoms vary from person to person and can range from mild to severe. Symptoms may include:  Fever or chills.  Cough.  Difficulty breathing or feeling short of breath.  Headaches, body aches, or muscle aches.  Runny or stuffy (congested) nose.  Sore throat.  New loss of taste or smell.  Nausea, vomiting, or diarrhea. These symptoms can appear anywhere from 2 to 14 days after you have been exposed to the virus. Some people may not have any symptoms. If you develop symptoms, call your health care provider. People with severe symptoms may need hospital care. Should I be tested for this virus? Your health care provider will decide whether to test you based on your symptoms, history of exposure, and your risk factors. How does a health care provider test for this virus? Health care providers will collect samples to send for testing. Samples may include:  Taking a swab of fluid from the back of your nose and throat, your nose, or your throat.  Taking fluid from the lungs by having you cough up mucus (sputum) into a sterile cup.  Taking a blood sample. Is there a treatment or vaccine for this virus? Currently, there is no vaccine to prevent coronavirus disease. Also, there are no medicines like antibiotics or antivirals to treat the virus. A person who becomes sick is given supportive care, which means rest and fluids. A person may also relieve his or her symptoms by using over-the-counter medicines that treat sneezing, coughing, and runny nose. These are the same medicines that a person takes for the common cold. If you develop symptoms, call your health care provider. People with severe symptoms may need hospital care. What can I do to protect myself and my family from  this virus?     You can protect yourself and your family by taking the same actions that you would take to prevent the spread of other viruses. Take the following actions:  Wash your hands often with soap and water for at least 20 seconds. If soap and water are not available, use alcohol-based hand sanitizer.  Avoid touching your face, mouth, nose, or eyes.  Cough or sneeze into a tissue, sleeve, or elbow. Do not cough or sneeze into your hand or the air. ? If you cough or sneeze into a tissue, throw it away immediately and wash your hands.  Disinfect objects and surfaces that you frequently touch every day.  Stay away from people who are sick.  Avoid going out in public, follow guidance from your state and local health authorities.  Avoid crowded indoor spaces. Stay at least 6 ft (2 m) away from others.  If you must go out in public, wear a cloth face covering or face mask. Make sure your mask covers your nose and mouth.  Stay home if you are sick, except to get medical care. Call your health care provider before you get medical care. Your health care provider will tell you how long to stay home.  Make sure your vaccines are up to date. Ask your health care provider  what vaccines you need. What should I do if I need to travel? Follow travel recommendations from your local health authority, the CDC, and WHO. Travel information and advice  Centers for Disease Control and Prevention (CDC): BodyEditor.hu  World Health Organization [facility]): ThirdIncome.ca Know the risks and take action to protect your health  You are at higher risk of getting coronavirus disease if you are traveling to areas with an outbreak or if you are exposed to travelers from areas with an outbreak.  Wash your hands often and practice good hygiene to lower the risk of catching or spreading the virus. What should I do  if I am sick? General instructions to stop the spread of infection  Wash your hands often with soap and water for at least 20 seconds. If soap and water are not available, use alcohol-based hand sanitizer.  Cough or sneeze into a tissue, sleeve, or elbow. Do not cough or sneeze into your hand or the air.  If you cough or sneeze into a tissue, throw it away immediately and wash your hands.  Stay home unless you must get medical care. Call your health care provider or local health authority before you get medical care.  Avoid public areas. Do not take public transportation, if possible.  If you can, wear a mask if you must go out of the house or if you are in close contact with someone who is not sick. Make sure your mask covers your nose and mouth. Keep your home clean  Disinfect objects and surfaces that are frequently touched every day. This may include: ? Counters and tables. ? Doorknobs and light switches. ? Sinks and faucets. ? Electronics such as phones, remote controls, keyboards, computers, and tablets.  Wash dishes in hot, soapy water or use a dishwasher. Air-dry your dishes.  Wash laundry in hot water. Prevent infecting other household members  Let healthy household members care for children and pets, if possible. If you have to care for children or pets, wash your hands often and wear a mask.  Sleep in a different bedroom or bed, if possible.  Do not share personal items, such as razors, toothbrushes, deodorant, combs, brushes, towels, and washcloths. Where to find more information Centers for Disease Control and Prevention (CDC)  Information and news updates: https://www.butler-gonzalez.com/ World Health Organization [facility])  Information and news updates: MissExecutive.com.ee  Coronavirus health topic: https://www.castaneda.info/  Questions and answers on COVID-19:  OpportunityDebt.at  Global tracker: who.sprinklr.com American Academy of Pediatrics (AAP)  Information for families: www.healthychildren.org/English/health-issues/conditions/chest-lungs/Pages/2019-Novel-Coronavirus.aspx The coronavirus situation is changing rapidly. Check your local health authority website or the CDC and [facility] websites for updates and news. When should I contact a health care provider?  Contact your health care provider if you have symptoms of an infection, such as fever or cough, and you: ? Have been near anyone who is known to have coronavirus disease. ? Have come into contact with a person who is suspected to have coronavirus disease. ? Have traveled to an area where there is an outbreak of COVID-19. When should I get emergency medical care?  Get help right away by calling your local emergency services (911 in the [place] if you have: ? Trouble breathing. ? Pain or pressure in your chest. ? Confusion. ? Blue-tinged lips and fingernails. ? Difficulty waking from sleep. ? Symptoms that get worse. Let the emergency medical personnel know if you think you have coronavirus disease. Summary  A new respiratory virus is spreading from person to person and  causing COVID-19 (coronavirus disease).  The virus that causes COVID-19 appears to spread easily. It spreads from one person to another through droplets from breathing, speaking, singing, coughing, or sneezing.  Older adults and those with chronic diseases are at higher risk of disease. If you are at higher risk for complications, take extra precautions.  There is currently no vaccine to prevent coronavirus disease. There are no medicines, such as antibiotics or antivirals, to treat the virus.  You can protect yourself and your family by washing your hands often, avoiding touching your face, and covering your coughs and sneezes. This information is not intended to replace advice given to you  by your health care provider. Make sure you discuss any questions you have with your health care provider. Document Revised: 11/01/2018 Document Reviewed: 04/30/2018 Elsevier Patient Education  2020 Elsevier Inc.   COVID-19 COVID-19 is a respiratory infection that is caused by a virus called severe acute respiratory syndrome coronavirus 2 (SARS-CoV-2). The disease is also known as coronavirus disease or novel coronavirus. In some people, the virus may not cause any symptoms. In others, it may cause a serious infection. The infection can get worse quickly and can lead to complications, such as:  Pneumonia, or infection of the lungs.  Acute respiratory distress syndrome or ARDS. This is a condition in which fluid build-up in the lungs prevents the lungs from filling with air and passing oxygen into the blood.  Acute respiratory failure. This is a condition in which there is not enough oxygen passing from the lungs to the body or when carbon dioxide is not passing from the lungs out of the body.  Sepsis or septic shock. This is a serious bodily reaction to an infection.  Blood clotting problems.  Secondary infections due to bacteria or fungus.  Organ failure. This is when your body's organs stop working. The virus that causes COVID-19 is contagious. This means that it can spread from person to person through droplets from coughs and sneezes (respiratory secretions). What are the causes? This illness is caused by a virus. You may catch the virus by:  Breathing in droplets from an infected person. Droplets can be spread by a person breathing, speaking, singing, coughing, or sneezing.  Touching something, like a table or a doorknob, that was exposed to the virus (contaminated) and then touching your mouth, nose, or eyes. What increases the risk? Risk for infection You are more likely to be infected with this virus if you:  Are within 6 feet (2 meters) of a person with COVID-19.  Provide  care for or live with a person who is infected with COVID-19.  Spend time in crowded indoor spaces or live in shared housing. Risk for serious illness You are more likely to become seriously ill from the virus if you:  Are 26 years of age or older. The higher your age, the more you are at risk for serious illness.  Live in a nursing home or long-term care facility.  Have cancer.  Have a long-term (chronic) disease such as: ? Chronic lung disease, including chronic obstructive pulmonary disease or asthma. ? A long-term disease that lowers your body's ability to fight infection (immunocompromised). ? Heart disease, including heart failure, a condition in which the arteries that lead to the heart become narrow or blocked (coronary artery disease), a disease which makes the heart muscle thick, weak, or stiff (cardiomyopathy). ? Diabetes. ? Chronic kidney disease. ? Sickle cell disease, a condition in which red blood cells  have an abnormal "sickle" shape. ? Liver disease.  Are obese. What are the signs or symptoms? Symptoms of this condition can range from mild to severe. Symptoms may appear any time from 2 to 14 days after being exposed to the virus. They include:  A fever or chills.  A cough.  Difficulty breathing.  Headaches, body aches, or muscle aches.  Runny or stuffy (congested) nose.  A sore throat.  New loss of taste or smell. Some people may also have stomach problems, such as nausea, vomiting, or diarrhea. Other people may not have any symptoms of COVID-19. How is this diagnosed? This condition may be diagnosed based on:  Your signs and symptoms, especially if: ? You live in an area with a COVID-19 outbreak. ? You recently traveled to or from an area where the virus is common. ? You provide care for or live with a person who was diagnosed with COVID-19. ? You were exposed to a person who was diagnosed with COVID-19.  A physical exam.  Lab tests, which may  include: ? Taking a sample of fluid from the back of your nose and throat (nasopharyngeal fluid), your nose, or your throat using a swab. ? A sample of mucus from your lungs (sputum). ? Blood tests.  Imaging tests, which may include, X-rays, CT scan, or ultrasound. How is this treated? At present, there is no medicine to treat COVID-19. Medicines that treat other diseases are being used on a trial basis to see if they are effective against COVID-19. Your health care provider will talk with you about ways to treat your symptoms. For most people, the infection is mild and can be managed at home with rest, fluids, and over-the-counter medicines. Treatment for a serious infection usually takes places in a hospital intensive care unit (ICU). It may include one or more of the following treatments. These treatments are given until your symptoms improve.  Receiving fluids and medicines through an IV.  Supplemental oxygen. Extra oxygen is given through a tube in the nose, a face mask, or a hood.  Positioning you to lie on your stomach (prone position). This makes it easier for oxygen to get into the lungs.  Continuous positive airway pressure (CPAP) or bi-level positive airway pressure (BPAP) machine. This treatment uses mild air pressure to keep the airways open. A tube that is connected to a motor delivers oxygen to the body.  Ventilator. This treatment moves air into and out of the lungs by using a tube that is placed in your windpipe.  Tracheostomy. This is a procedure to create a hole in the neck so that a breathing tube can be inserted.  Extracorporeal membrane oxygenation (ECMO). This procedure gives the lungs a chance to recover by taking over the functions of the heart and lungs. It supplies oxygen to the body and removes carbon dioxide. Follow these instructions at home: Lifestyle  If you are sick, stay home except to get medical care. Your health care provider will tell you how long to  stay home. Call your health care provider before you go for medical care.  Rest at home as told by your health care provider.  Do not use any products that contain nicotine or tobacco, such as cigarettes, e-cigarettes, and chewing tobacco. If you need help quitting, ask your health care provider.  Return to your normal activities as told by your health care provider. Ask your health care provider what activities are safe for you. General instructions  Take over-the-counter  and prescription medicines only as told by your health care provider.  Drink enough fluid to keep your urine pale yellow.  Keep all follow-up visits as told by your health care provider. This is important. How is this prevented?  There is no vaccine to help prevent COVID-19 infection. However, there are steps you can take to protect yourself and others from this virus. To protect yourself:   Do not travel to areas where COVID-19 is a risk. The areas where COVID-19 is reported change often. To identify high-risk areas and travel restrictions, check the CDC travel website: StageSync.si  If you live in, or must travel to, an area where COVID-19 is a risk, take precautions to avoid infection. ? Stay away from people who are sick. ? Wash your hands often with soap and water for 20 seconds. If soap and water are not available, use an alcohol-based hand sanitizer. ? Avoid touching your mouth, face, eyes, or nose. ? Avoid going out in public, follow guidance from your state and local health authorities. ? If you must go out in public, wear a cloth face covering or face mask. Make sure your mask covers your nose and mouth. ? Avoid crowded indoor spaces. Stay at least 6 feet (2 meters) away from others. ? Disinfect objects and surfaces that are frequently touched every day. This may include:  Counters and tables.  Doorknobs and light switches.  Sinks and faucets.  Electronics, such as phones, remote  controls, keyboards, computers, and tablets. To protect others: If you have symptoms of COVID-19, take steps to prevent the virus from spreading to others.  If you think you have a COVID-19 infection, contact your health care provider right away. Tell your health care team that you think you may have a COVID-19 infection.  Stay home. Leave your house only to seek medical care. Do not use public transport.  Do not travel while you are sick.  Wash your hands often with soap and water for 20 seconds. If soap and water are not available, use alcohol-based hand sanitizer.  Stay away from other members of your household. Let healthy household members care for children and pets, if possible. If you have to care for children or pets, wash your hands often and wear a mask. If possible, stay in your own room, separate from others. Use a different bathroom.  Make sure that all people in your household wash their hands well and often.  Cough or sneeze into a tissue or your sleeve or elbow. Do not cough or sneeze into your hand or into the air.  Wear a cloth face covering or face mask. Make sure your mask covers your nose and mouth. Where to find more information  Centers for Disease Control and Prevention: StickerEmporium.tn  World Health Organization: https://thompson-craig.com/ Contact a health care provider if:  You live in or have traveled to an area where COVID-19 is a risk and you have symptoms of the infection.  You have had contact with someone who has COVID-19 and you have symptoms of the infection. Get help right away if:  You have trouble breathing.  You have pain or pressure in your chest.  You have confusion.  You have bluish lips and fingernails.  You have difficulty waking from sleep.  You have symptoms that get worse. These symptoms may represent a serious problem that is an emergency. Do not wait to see if the symptoms will go away.  Get medical help right away. Call your local emergency  services (911 in the [place] Do not drive yourself to the hospital. Let the emergency medical personnel know if you think you have COVID-19. Summary  COVID-19 is a respiratory infection that is caused by a virus. It is also known as coronavirus disease or novel coronavirus. It can cause serious infections, such as pneumonia, acute respiratory distress syndrome, acute respiratory failure, or sepsis.  The virus that causes COVID-19 is contagious. This means that it can spread from person to person through droplets from breathing, speaking, singing, coughing, or sneezing.  You are more likely to develop a serious illness if you are 23 years of age or older, have a weak immune system, live in a nursing home, or have chronic disease.  There is no medicine to treat COVID-19. Your health care provider will talk with you about ways to treat your symptoms.  Take steps to protect yourself and others from infection. Wash your hands often and disinfect objects and surfaces that are frequently touched every day. Stay away from people who are sick and wear a mask if you are sick. This information is not intended to replace advice given to you by your health care provider. Make sure you discuss any questions you have with your health care provider. Document Revised: 11/01/2018 Document Reviewed: 02/07/2018 Elsevier Patient Education  2020 Elsevier Inc. Patient scheduled for outpatient Remdesivir infusions at 1pm on Tuesday 12/7 at [facility]. Please inform the patient to park at [address] [place], [place], as staff will be escorting the patient through the east entrance of the hospital. Appointments take approximately 45 minutes.    There is a wave flag banner located near the entrance on [address]. Turn into this entrance and immediately turn left or right and park in 1 of the 10 designated Covid Infusion Parking spots. There is a phone number on  the sign, please call and let the staff know what spot you are in and we will come out and get you. For questions call [phone].  Thanks.

## 2019-12-22 NOTE — Discharge Summary (Signed)
Physician Discharge Summary  Gary Silva JXB:[id number] DOB: [birth year] DOA: 12/19/2019  PCP: Ileana LaddWong, Francis P, MD  Admit date: 12/19/2019 Discharge date: 12/22/2019  Admitted From: Home Disposition:  Home  Recommendations for Outpatient Follow-up:  1. Follow up with PCP in 1-2 weeks 2. Please obtain BMP/CBC in one week   Home Health:No Equipment/Devices:None  Discharge Condition:Stable CODE STATUS:Full Diet recommendation: Heart Healthy  Brief/Interim Summary: 54 y.o. male past medical history of hyperlipidemia [facility] for myalgias and fever is lately shortness of breath was found to be hypoxic which improved on 2 L of oxygen with SARS-CoV-2 positive PCR, chest x-ray did not show any infiltrate CT angio of the chest showed no PE but bilateral infiltrates he was started on IV remdesivir and steroids.  Discharge Diagnoses:  Principal Problem:   Pneumonia due to COVID-19 virus Active Problems:   Hypercholesteremia   Hypokalemia Acute respiratory failure with hypoxia secondarily to COVID-19 pneumonia: He was started on IV remdesivir and steroids he was weaned to room air his Solu-Medrol was transitioned to oral dexamethasone, will finish his final dose of IV remdesivir as an outpatient with outpatient infusion clinic.  History of tobacco abuse: Counseling.  Hyperkalemia: Repleted orally now resolved.  Hypercholesterolemia: Continue statins.   Discharge Instructions  Discharge Instructions    Diet - low sodium heart healthy   Complete by: As directed    Increase activity slowly   Complete by: As directed      Allergies as of 12/22/2019   No Known Allergies     Medication List    TAKE these medications   budesonide-formoterol 160-4.5 MCG/ACT inhaler Commonly known as: SYMBICORT Inhale 2 puffs into the lungs 2 (two) times daily.   dexamethasone 0.5 MG tablet Commonly known as: Decadron Take 2 tablets (1 mg total) by mouth  every 6 (six) hours for 2 days.   montelukast 10 MG tablet Commonly known as: SINGULAIR Take 1 tablet by mouth daily.   rosuvastatin 40 MG tablet Commonly known as: CRESTOR Take 40 mg by mouth daily.       No Known Allergies  Consultations:  None   Procedures/Studies: CT Angio Chest PE W/Cm &/Or Wo Cm  Result Date: 12/19/2019 CLINICAL DATA:  Chest pain, cough, fever and positive PCR for COVID-19. EXAM: CT ANGIOGRAPHY CHEST WITH CONTRAST TECHNIQUE: Multidetector CT imaging of the chest was performed using the standard protocol during bolus administration of intravenous contrast. Multiplanar CT image reconstructions and MIPs were obtained to evaluate the vascular anatomy. CONTRAST:  87mL OMNIPAQUE IOHEXOL 350 MG/ML SOLN COMPARISON:  None. FINDINGS: Cardiovascular: Satisfactory opacification of the pulmonary arteries to the segmental level. No evidence of pulmonary embolism. Normal caliber central pulmonary arteries. The thoracic aorta is also well opacified and demonstrates no evidence aneurysmal disease or dissection. There is mild atherosclerotic plaque at the level of the aortic arch and descending thoracic aorta. Normal heart size. No pericardial effusion. Mediastinum/Nodes: No enlarged mediastinal, hilar, or axillary lymph nodes. Thyroid gland, trachea, and esophagus demonstrate no significant findings. Lungs/Pleura: Diffuse pattern of multifocal peripheral ground-glass and interstitial opacities throughout all lobes of both lungs consistent with bilateral COVID-19 pneumonia. No associated pneumothorax, pulmonary edema or pleural fluid identified. Upper Abdomen: No acute abnormality. Musculoskeletal: No chest wall abnormality. No acute or significant osseous findings. Review of the MIP images confirms the above findings. IMPRESSION: 1. No evidence of pulmonary embolism. 2. Diffuse pattern of multifocal peripheral ground-glass and interstitial opacities throughout all lobes of both lungs  consistent with bilateral COVID-19 pneumonia. 3. Mild aortic atherosclerosis. Aortic Atherosclerosis (ICD10-I70.0). Electronically Signed   By: Irish Lack M.D.   On: 12/19/2019 16:42   DG Chest Port 1 View  Result Date: 12/19/2019 CLINICAL DATA:  Chest pain, COVID-19. EXAM: PORTABLE CHEST 1 VIEW COMPARISON:  January 31, 2018. FINDINGS: The heart size and mediastinal contours are within normal limits. Both lungs are clear. No pneumothorax or pleural effusion is noted. The visualized skeletal structures are unremarkable. IMPRESSION: No active disease. Electronically Signed   By: Lupita Raider M.D.   On: 12/19/2019 13:25     Subjective: No new complaints.  Discharge Exam: Vitals:   12/21/19 2050 12/22/19 0501  BP: 115/83 106/77  Pulse: 77 65  Resp: (!) 21 16  Temp: 98.3 F (36.8 C) 97.8 F (36.6 C)  SpO2: 97% 93%   Vitals:   12/21/19 0507 12/21/19 1406 12/21/19 2050 12/22/19 0501  BP: 106/74 118/80 115/83 106/77  Pulse: 71 84 77 65  Resp: 18 15 (!) 21 16  Temp: 98 F (36.7 C) (!) 97.5 F (36.4 C) 98.3 F (36.8 C) 97.8 F (36.6 C)  TempSrc: Oral  Oral Oral  SpO2: 95% 96% 97% 93%  Weight:      Height:        General: Pt is alert, awake, not in acute distress Cardiovascular: RRR, S1/S2 +, no rubs, no gallops Respiratory: CTA bilaterally, no wheezing, no rhonchi Abdominal: Soft, NT, ND, bowel sounds + Extremities: no edema, no cyanosis    The results of significant diagnostics from this hospitalization (including imaging, microbiology, ancillary and laboratory) are listed below for reference.     Microbiology: Recent Results (from the past 240 hour(s))  Resp Panel by RT-PCR (Flu A&B, Covid) Nasopharyngeal Swab     Status: Abnormal   Collection Time: 12/19/19  2:27 PM   Specimen: Nasopharyngeal Swab; Nasopharyngeal(NP) swabs in vial transport medium  Result Value Ref Range Status   SARS Coronavirus 2 by RT PCR POSITIVE (A) NEGATIVE Final    Comment: RESULT  CALLED TO, READ BACK BY AND VERIFIED WITH: SIMMS MARVA, RN @ 1531 ON 12/19/2019, [email or web address] (NOTE) SARS-CoV-2 target nucleic acids are DETECTED.  The SARS-CoV-2 RNA is generally detectable in upper respiratory specimens during the acute phase of infection. Positive results are indicative of the presence of the identified virus, but do not rule out bacterial infection or co-infection with other pathogens not detected by the test. Clinical correlation with patient history and other diagnostic information is necessary to determine patient infection status. The expected result is Negative.  Fact Sheet for Patients: BloggerCourse.com  Fact Sheet for Healthcare Providers: SeriousBroker.it  This test is not yet approved or cleared by the [place] FDA and  has been authorized for detection and/or diagnosis of SARS-CoV-2 by FDA under an Emergency Use Authorization (EUA).  This EUA will remain in effect (meaning  this test can be used) for the duration of  the COVID-19 declaration under Section 564(b)(1) of the Act, 21 U.S.C. section 360bbb-3(b)(1), unless the authorization is terminated or revoked sooner.     Influenza A by PCR NEGATIVE NEGATIVE Final   Influenza B by PCR NEGATIVE NEGATIVE Final    Comment: (NOTE) The Xpert Xpress SARS-CoV-2/FLU/RSV plus assay is intended as an aid in the diagnosis of influenza from Nasopharyngeal swab specimens and should not be used as a sole basis for treatment. Nasal washings and aspirates are unacceptable for Xpert Xpress SARS-CoV-2/FLU/RSV testing.  Fact Sheet for Patients: BloggerCourse.com  Fact Sheet for Healthcare Providers: SeriousBroker.it  This test is not yet approved or cleared by the [place] FDA and has been authorized for detection and/or diagnosis of SARS-CoV-2 by FDA under an Emergency Use Authorization (EUA). This EUA  will remain in effect (meaning this test can be used) for the duration of the COVID-19 declaration under Section 564(b)(1) of the Act, 21 U.S.C. section 360bbb-3(b)(1), unless the authorization is terminated or revoked.  Performed at [facility], [address] [place], [place] [address]   Culture, blood (Routine X 2) w Reflex to ID Panel     Status: None (Preliminary result)   Collection Time: 12/19/19  3:30 PM   Specimen: BLOOD  Result Value Ref Range Status   Specimen Description   Final    BLOOD LEFT ANTECUBITAL Performed at [facility], [address] [place], [place] [address]    Special Requests   Final    BOTTLES DRAWN AEROBIC AND ANAEROBIC Blood Culture adequate volume Performed at [facility], [address] [place], [place] [address]    Culture   Final    NO GROWTH 3 DAYS Performed at [facility], [address] [place], [place] [address]    Report Status PENDING  Incomplete  Culture, blood (Routine X 2) w Reflex to ID Panel     Status: None (Preliminary result)   Collection Time: 12/19/19  3:35 PM   Specimen: BLOOD LEFT HAND  Result Value Ref Range Status   Specimen Description   Final    BLOOD LEFT HAND Performed at [facility], [address], [place], [place] [address]    Special Requests   Final    BOTTLES DRAWN AEROBIC AND ANAEROBIC Blood Culture adequate volume Performed at [facility], [address] [place], [place] [address]    Culture   Final    NO GROWTH 3 DAYS Performed at [facility], [address] [place], [place] [address]    Report Status PENDING  Incomplete     Labs: BNP (last 3 results) No results for input(s): BNP in the last 8760 hours. Basic Metabolic Panel: Recent Labs  Lab 12/19/19 1537 12/20/19 0521 12/21/19 0455 12/22/19 0713  NA 134* 136 139 139  K 3.2* 4.1 4.2 4.2  CL 98 101 104 105  CO2 28 25 25 24   GLUCOSE 129* 164* 182* 149*  BUN 20 23* 29*  28*  CREATININE 1.23 1.11 1.07 0.93  CALCIUM 8.1* 8.5* 8.8* 8.4*  MG  --  2.5*  --   --   PHOS  --  2.1*  --   --    Liver Function Tests: Recent Labs  Lab 12/19/19 1537 12/20/19 0521 12/21/19 0455 12/22/19 0713  AST 30 36 32 31  ALT 35 42 40 44  ALKPHOS 47 56 56 48  BILITOT 0.9 0.9 0.4 0.5  PROT 6.5 6.8 6.5 5.9*  ALBUMIN 3.7 3.7 3.6 3.3*   No results for input(s): LIPASE, AMYLASE in the last 168 hours. No results for input(s): AMMONIA in the last 168 hours. CBC: Recent Labs  Lab 12/19/19 1537 12/20/19 0521  WBC 6.8 4.7  NEUTROABS 5.7 3.9  HGB 15.0 15.4  HCT 43.5 45.7  MCV 85.6 87.5  PLT 193 204   Cardiac Enzymes: No results for input(s): CKTOTAL, CKMB, CKMBINDEX, TROPONINI in the last 168 hours. BNP: Invalid input(s): POCBNP CBG: No results for input(s):  GLUCAP in the last 168 hours. D-Dimer Recent Labs    12/21/19 0455 12/22/19 0713  DDIMER 0.28 <0.27   Hgb A1c No results for input(s): HGBA1C in the last 72 hours. Lipid Profile Recent Labs    12/19/19 1711  TRIG 121   Thyroid function studies No results for input(s): TSH, T4TOTAL, T3FREE, THYROIDAB in the last 72 hours.  Invalid input(s): FREET3 Anemia work up Recent Labs    12/19/19 1711 12/20/19 0521  FERRITIN 562* 631*   Urinalysis    Component Value Date/Time   COLORURINE YELLOW 12/19/2019 1537   APPEARANCEUR CLEAR 12/19/2019 1537   LABSPEC 1.025 12/19/2019 1537   PHURINE 6.0 12/19/2019 1537   GLUCOSEU NEGATIVE 12/19/2019 1537   HGBUR TRACE (A) 12/19/2019 1537   BILIRUBINUR NEGATIVE 12/19/2019 1537   KETONESUR 15 (A) 12/19/2019 1537   PROTEINUR 30 (A) 12/19/2019 1537   NITRITE NEGATIVE 12/19/2019 1537   LEUKOCYTESUR NEGATIVE 12/19/2019 1537   Sepsis Labs Invalid input(s): PROCALCITONIN,  WBC,  LACTICIDVEN Microbiology Recent Results (from the past 240 hour(s))  Resp Panel by RT-PCR (Flu A&B, Covid) Nasopharyngeal Swab     Status: Abnormal   Collection Time: 12/19/19  2:27 PM    Specimen: Nasopharyngeal Swab; Nasopharyngeal(NP) swabs in vial transport medium  Result Value Ref Range Status   SARS Coronavirus 2 by RT PCR POSITIVE (A) NEGATIVE Final    Comment: RESULT CALLED TO, READ BACK BY AND VERIFIED WITH: SIMMS MARVA, RN @ 1531 ON 12/19/2019, [email or web address] (NOTE) SARS-CoV-2 target nucleic acids are DETECTED.  The SARS-CoV-2 RNA is generally detectable in upper respiratory specimens during the acute phase of infection. Positive results are indicative of the presence of the identified virus, but do not rule out bacterial infection or co-infection with other pathogens not detected by the test. Clinical correlation with patient history and other diagnostic information is necessary to determine patient infection status. The expected result is Negative.  Fact Sheet for Patients: BloggerCourse.com  Fact Sheet for Healthcare Providers: SeriousBroker.it  This test is not yet approved or cleared by the [place] FDA and  has been authorized for detection and/or diagnosis of SARS-CoV-2 by FDA under an Emergency Use Authorization (EUA).  This EUA will remain in effect (meaning  this test can be used) for the duration of  the COVID-19 declaration under Section 564(b)(1) of the Act, 21 U.S.C. section 360bbb-3(b)(1), unless the authorization is terminated or revoked sooner.     Influenza A by PCR NEGATIVE NEGATIVE Final   Influenza B by PCR NEGATIVE NEGATIVE Final    Comment: (NOTE) The Xpert Xpress SARS-CoV-2/FLU/RSV plus assay is intended as an aid in the diagnosis of influenza from Nasopharyngeal swab specimens and should not be used as a sole basis for treatment. Nasal washings and aspirates are unacceptable for Xpert Xpress SARS-CoV-2/FLU/RSV testing.  Fact Sheet for Patients: BloggerCourse.com  Fact Sheet for Healthcare  Providers: SeriousBroker.it  This test is not yet approved or cleared by the [place] FDA and has been authorized for detection and/or diagnosis of SARS-CoV-2 by FDA under an Emergency Use Authorization (EUA). This EUA will remain in effect (meaning this test can be used) for the duration of the COVID-19 declaration under Section 564(b)(1) of the Act, 21 U.S.C. section 360bbb-3(b)(1), unless the authorization is terminated or revoked.  Performed at [facility], [address] [place], [place] [address]   Culture, blood (Routine X 2) w Reflex to ID Panel     Status: None (Preliminary result)  Collection Time: 12/19/19  3:30 PM   Specimen: BLOOD  Result Value Ref Range Status   Specimen Description   Final    BLOOD LEFT ANTECUBITAL Performed at [facility], [address] [place], [place] [address]    Special Requests   Final    BOTTLES DRAWN AEROBIC AND ANAEROBIC Blood Culture adequate volume Performed at [facility], [address] [place], [place] [address]    Culture   Final    NO GROWTH 3 DAYS Performed at [facility], [address] [place], [place] [address]    Report Status PENDING  Incomplete  Culture, blood (Routine X 2) w Reflex to ID Panel     Status: None (Preliminary result)   Collection Time: 12/19/19  3:35 PM   Specimen: BLOOD LEFT HAND  Result Value Ref Range Status   Specimen Description   Final    BLOOD LEFT HAND Performed at [facility], [address] [place], [place] [address]    Special Requests   Final    BOTTLES DRAWN AEROBIC AND ANAEROBIC Blood Culture adequate volume Performed at [facility], [address] [place], [place] [address]    Culture   Final    NO GROWTH 3 DAYS Performed at [facility], [address], [place], [place] [address]    Report Status PENDING  Incomplete     Time coordinating discharge: Over 30  minutes  SIGNED:   Marinda Elk, MD  [facility] 12/22/2019, 8:57 AM Pager   If 7PM-7AM, please contact night-coverage [email or web address] Password [email or web address]

## 2019-12-22 NOTE — Progress Notes (Signed)
Patient scheduled for outpatient Remdesivir infusions at 1pm on Tuesday 12/7 at [facility]. Please inform the patient to park at [address] [place], [place], as staff will be escorting the patient through the east entrance of the hospital. Appointments take approximately 45 minutes.    There is a wave flag banner located near the entrance on [address]. Turn into this entrance and immediately turn left or right and park in 1 of the 10 designated Covid Infusion Parking spots. There is a phone number on the sign, please call and let the staff know what spot you are in and we will come out and get you. For questions call [phone].  Thanks.

## 2019-12-23 ENCOUNTER — Ambulatory Visit (HOSPITAL_COMMUNITY)
Admit: 2019-12-23 | Discharge: 2019-12-23 | Disposition: A | Discharge status: Home / Self Care | Payer: BC Managed Care – PPO | Source: Ambulatory Visit | Attending: Pulmonary Disease | Admitting: Pulmonary Disease

## 2019-12-23 DIAGNOSIS — U071 COVID-19: Secondary | ICD-10-CM | POA: Insufficient documentation

## 2019-12-23 LAB — CULTURE, BLOOD (ROUTINE X 2): Culture: NO GROWTH

## 2019-12-23 MED ORDER — SODIUM CHLORIDE 0.9 % IV SOLN: INTRAVENOUS | Status: DC | PRN

## 2019-12-23 MED ORDER — ALBUTEROL SULFATE HFA 108 (90 BASE) MCG/ACT IN AERS: 2.0000 | INHALATION_SPRAY | Freq: Once | RESPIRATORY_TRACT | Status: DC | PRN

## 2019-12-23 MED ORDER — EPINEPHRINE 0.3 MG/0.3ML IJ SOAJ: 0.3000 mg | Freq: Once | INTRAMUSCULAR | Status: DC | PRN

## 2019-12-23 MED ORDER — SODIUM CHLORIDE 0.9 % IV SOLN
100.0000 mg | Freq: Once | INTRAVENOUS | Status: AC
  Administered 2019-12-23: 100 mg via INTRAVENOUS

## 2019-12-23 MED ORDER — DIPHENHYDRAMINE HCL 50 MG/ML IJ SOLN: 50.0000 mg | Freq: Once | INTRAMUSCULAR | Status: DC | PRN

## 2019-12-23 MED ORDER — METHYLPREDNISOLONE SODIUM SUCC 125 MG IJ SOLR: 125.0000 mg | Freq: Once | INTRAMUSCULAR | Status: DC | PRN

## 2019-12-23 MED ORDER — FAMOTIDINE IN NACL 20-0.9 MG/50ML-% IV SOLN: 20.0000 mg | Freq: Once | INTRAVENOUS | Status: DC | PRN

## 2019-12-23 NOTE — Discharge Instructions (Signed)
10 Things You Can Do to Manage Your COVID-19 Symptoms at Home If you have possible or confirmed COVID-19: 1. Stay home from work and school. And stay away from other public places. If you must go out, avoid using any kind of public transportation, ridesharing, or taxis. 2. Monitor your symptoms carefully. If your symptoms get worse, call your healthcare provider immediately. 3. Get rest and stay hydrated. 4. If you have a medical appointment, call the healthcare provider ahead of time and tell them that you have or may have COVID-19. 5. For medical emergencies, call 911 and notify the dispatch personnel that you have or may have COVID-19. 6. Cover your cough and sneezes with a tissue or use the inside of your elbow. 7. Wash your hands often with soap and water for at least 20 seconds or clean your hands with an alcohol-based hand sanitizer that contains at least 60% alcohol. 8. As much as possible, stay in a specific room and away from other people in your home. Also, you should use a separate bathroom, if available. If you need to be around other people in or outside of the home, wear a mask. 9. Avoid sharing personal items with other people in your household, like dishes, towels, and bedding. 10. Clean all surfaces that are touched often, like counters, tabletops, and doorknobs. Use household cleaning sprays or wipes according to the label instructions. cdc.gov/coronavirus 07/17/2018 This information is not intended to replace advice given to you by your health care provider. Make sure you discuss any questions you have with your health care provider. Document Revised: 12/19/2018 Document Reviewed: 12/19/2018 Elsevier Patient Education  2020 Elsevier Inc.  

## 2019-12-23 NOTE — Progress Notes (Signed)
  Diagnosis: COVID-19  Physician: Dr. Shan Levans  Procedure: Covid Infusion Clinic Med: remdesivir infusion - Provided patient with remdesivir fact sheet for patients, parents and caregivers prior to infusion.  Complications: No immediate complications noted.  Discharge: Discharged home   Gary Silva 12/23/2019

## 2019-12-24 LAB — CULTURE, BLOOD (ROUTINE X 2)
Culture: NO GROWTH
Special Requests: ADEQUATE

## 2020-01-12 DIAGNOSIS — Z8679 Personal history of other diseases of the circulatory system: Secondary | ICD-10-CM | POA: Diagnosis not present

## 2020-01-12 DIAGNOSIS — R739 Hyperglycemia, unspecified: Secondary | ICD-10-CM | POA: Diagnosis not present

## 2020-01-12 DIAGNOSIS — U071 COVID-19: Secondary | ICD-10-CM | POA: Diagnosis not present

## 2020-01-12 DIAGNOSIS — R062 Wheezing: Secondary | ICD-10-CM | POA: Diagnosis not present

## 2020-01-22 DIAGNOSIS — R062 Wheezing: Secondary | ICD-10-CM | POA: Diagnosis not present

## 2020-01-22 DIAGNOSIS — Z8679 Personal history of other diseases of the circulatory system: Secondary | ICD-10-CM | POA: Diagnosis not present

## 2020-01-22 DIAGNOSIS — U071 COVID-19: Secondary | ICD-10-CM | POA: Diagnosis not present

## 2020-01-22 DIAGNOSIS — R739 Hyperglycemia, unspecified: Secondary | ICD-10-CM | POA: Diagnosis not present

## 2020-01-23 DIAGNOSIS — R739 Hyperglycemia, unspecified: Secondary | ICD-10-CM | POA: Diagnosis not present

## 2020-10-18 DIAGNOSIS — J209 Acute bronchitis, unspecified: Secondary | ICD-10-CM | POA: Diagnosis not present

## 2020-10-18 DIAGNOSIS — R0981 Nasal congestion: Secondary | ICD-10-CM | POA: Diagnosis not present

## 2020-10-18 DIAGNOSIS — R062 Wheezing: Secondary | ICD-10-CM | POA: Diagnosis not present

## 2020-10-18 DIAGNOSIS — Z20822 Contact with and (suspected) exposure to covid-19: Secondary | ICD-10-CM | POA: Diagnosis not present

## 2021-01-13 DIAGNOSIS — J40 Bronchitis, not specified as acute or chronic: Secondary | ICD-10-CM | POA: Diagnosis not present

## 2021-02-07 DIAGNOSIS — R053 Chronic cough: Secondary | ICD-10-CM | POA: Diagnosis not present

## 2021-02-07 DIAGNOSIS — Z8616 Personal history of COVID-19: Secondary | ICD-10-CM | POA: Diagnosis not present

## 2021-02-07 DIAGNOSIS — R0981 Nasal congestion: Secondary | ICD-10-CM | POA: Diagnosis not present

## 2021-02-23 ENCOUNTER — Ambulatory Visit (INDEPENDENT_AMBULATORY_CARE_PROVIDER_SITE_OTHER): Payer: BC Managed Care – PPO | Admitting: Internal Medicine

## 2021-02-23 ENCOUNTER — Encounter: Payer: Self-pay | Admitting: Internal Medicine

## 2021-02-23 ENCOUNTER — Other Ambulatory Visit: Payer: Self-pay

## 2021-02-23 VITALS — BP 122/70 | HR 76 | Temp 98.1°F | Ht 65.0 in | Wt 156.0 lb

## 2021-02-23 DIAGNOSIS — R053 Chronic cough: Secondary | ICD-10-CM | POA: Diagnosis not present

## 2021-02-23 MED ORDER — FLUTICASONE PROPIONATE 50 MCG/ACT NA SUSP: 1.0000 | Freq: Every day | NASAL | 5 refills | Status: AC

## 2021-02-23 MED ORDER — CHLORPHENIRAMINE MALEATE 4 MG PO TABS: 4.0000 mg | ORAL_TABLET | Freq: Two times a day (BID) | ORAL | 0 refills | Status: DC | PRN

## 2021-02-23 NOTE — Progress Notes (Signed)
Gary Silva    [id number]    [birth year]  Primary Care Physician:Wong, Maryla [place], MD  Referring Physician: Ileana Ladd, MD [address] [place],  [place] [address] Reason for Consultation: chronic cough Date of Consultation: 02/23/2021  Chief complaint:   Chief Complaint  Patient presents with   Consult    Pt states he has had a chronic cough since he had covid a year ago. Pt states he has been on multiple rounds of steroids with no relief. States he has coughed so hard before to the point that he has passed out. States he does have SOB associated with the cough.     HPI: Gary Silva is a 56 y.o. man who presents for new patient evaluation of chronic cough. Had severe COVID infection in December 2021. Was hospitalized for severe covid then and was on oxygen, discharged home on room air. Since then has had chronic cough, post nasal drainage, sore throat.   Had an episode of cough related syncope last month.  He has regular cough which is usually dry, sometimes some phlegm. Has a strong tickle in his throat.   Has had multiple rounds of steroids and antibiotics which seem to lessen symptoms but does not reduce completely.   He is on an anti-histamine twice daily, montelukast, steroids. Seems to be helping. He has been on this for about a month. Has been on 40 mg prednisone daily for the past month.   He is on a symbicort inhaler with prn albuterol. He thinks the symbicort seems to help. Albuterol not doing anything.   No childhood respiratory disease Was an athlete, used to teach martial arts, he used to run track and keep up with his peers.   Originally from [place] and used to have seasonal allergies. Had allergy shots when he moved down here and now has year round symptoms.   He stopped taking the cholesterol medicine wondering if that was causing his cough.   Has been taking saline spray which has been helping a lot.   He also has shortness of breath  with chest tightness sometimes. Symptoms are actually better with exertion and cold air.     Social history:  Occupation:  works as Transport planner for [employer].  Exposures: lives at home with wife and two kids, has a Health and safety inspector.  Smoking history: former smoker 20 pack years quit 2023  Social History   Occupational History   Not on file  Tobacco Use   Smoking status: Former    Packs/day: 2.00    Years: 10.00    Pack years: 20.00    Types: Cigarettes    Quit date: 2003    Years since quitting: 20.1   Smokeless tobacco: Never  Substance and Sexual Activity   Alcohol use: Yes    Comment: Once a month   Drug use: Never   Sexual activity: Not on file    Relevant family history:  Family History  Problem Relation Age of Onset   Asthma Neg Hx     Past Medical History:  Diagnosis Date   Hypercholesteremia     Past Surgical History:  Procedure Laterality Date   NASAL SEPTUM SURGERY       Physical Exam: Blood pressure 122/70, pulse 76, temperature 98.1 F (36.7 C), temperature source Oral, height 5\' 5"  (1.651 m), weight 156 lb (70.8 kg), SpO2 97 %. Gen:      No acute distress,  frequent cough ENT:  +nasal debris no nasal polyps, mucus membranes moist, +cobblestoning in oropharynx Lungs:    No increased respiratory effort, symmetric chest wall excursion, clear to auscultation bilaterally, no wheezes or crackles CV:         Regular rate and rhythm; no murmurs, rubs, or gallops.  No pedal edema Abd:      + bowel sounds; soft, non-tender; no distension MSK: no acute synovitis of DIP or PIP joints, no mechanics hands.  Skin:      Warm and dry; no rashes Neuro: normal speech, no focal facial asymmetry Psych: alert and oriented x3, normal mood and affect   Data Reviewed/Medical Decision Making:  Independent interpretation of tests: Imaging:  Review of patient's ct chest dec 2021 shows patchy multifocal airspace opacities consistent with covid.  The patient's images  have been independently reviewed by me.    PFTs:  No flowsheet data found.  Labs:  Lab Results  Component Value Date   WBC 4.7 12/20/2019   HGB 15.4 12/20/2019   HCT 45.7 12/20/2019   MCV 87.5 12/20/2019   PLT 204 12/20/2019   Lab Results  Component Value Date   NA 139 12/22/2019   K 4.2 12/22/2019   CL 105 12/22/2019   CO2 24 12/22/2019     Immunization status:  Immunization History  Administered Date(s) Administered   Influenza Split 11/16/2017   Influenza,inj,quad, With Preservative 12/23/2014   Tdap 12/22/2013     I reviewed prior external note(s) from [place] PCP  I reviewed the result(s) of the labs and imaging as noted above.   I have ordered spiro/feno  Discussion of management or test interpretation with another colleague .   Assessment:  Chronic Cough Shortness of breath  Plan/Recommendations:  Taper off prednisone.   Start flonase nasal spray bid. Start chlorpheniramine BID for the next two weeks. Hold xyzal and allegra while doing this. Can resume after two weeks and stopping chlorpheniramine.  Will get spiro/feno at next visit. Evaluate for asthma.   Limit symbicort to twice a day - currently taking 3-4 times/day.    We discussed disease management and progression at length today.     Return to Care: Return in about 2 months (around 04/23/2021).  Durel Salts, MD Pulmonary and Critical Care Medicine [facility] Office:[phone]  CC: Ileana Ladd, MD

## 2021-02-23 NOTE — Patient Instructions (Addendum)
Please schedule follow up scheduled with myself in 2 months.  If my schedule is not open yet, we will contact you with a reminder closer to that time. Please call [phone] if you haven't heard from [place] a month before.   Before your next visit I would like you to have: Spirometry/Feno (30 minutes)   Stop taking prednisone. Go down to 1 pill (20 mg) once a day for the next three days. Then go down to 1/2 pill (10 mg) once a day for the next three days. Then stop prednisone.   Stop allegra and xyzal while you take the new anti-histamine.   Take this 1 pill twice a day.  Start taking flonase.  Continue nasal saline irrigation  Flonase - 1 spray on each side of your nose twice a day for first week, then 1 spray on each side.   Instructions for use: If you also use a saline nasal spray or rinse, use that first. Position the head with the chin slightly tucked. Use the right hand to spray into the left nostril and the right hand to spray into the left nostril.   Point the bottle away from the septum of your nose (cartilage that divides the two sides of your nose).  Hold the nostril closed on the opposite side from where you will spray Spray once and gently sniff to pull the medicine into the higher parts of your nose.  Don't sniff too hard as the medicine will drain down the back of your throat instead. Repeat with a second spray on the same side if prescribed. Repeat on the other side of your nose.

## 2021-05-09 ENCOUNTER — Other Ambulatory Visit: Payer: Self-pay | Admitting: *Deleted

## 2021-05-09 ENCOUNTER — Ambulatory Visit (INDEPENDENT_AMBULATORY_CARE_PROVIDER_SITE_OTHER): Payer: BC Managed Care – PPO | Admitting: Internal Medicine

## 2021-05-09 ENCOUNTER — Encounter: Payer: Self-pay | Admitting: Internal Medicine

## 2021-05-09 VITALS — BP 128/82 | HR 88 | Ht 65.0 in | Wt 169.0 lb

## 2021-05-09 DIAGNOSIS — J4520 Mild intermittent asthma, uncomplicated: Secondary | ICD-10-CM | POA: Diagnosis not present

## 2021-05-09 DIAGNOSIS — R053 Chronic cough: Secondary | ICD-10-CM

## 2021-05-09 DIAGNOSIS — J301 Allergic rhinitis due to pollen: Secondary | ICD-10-CM | POA: Diagnosis not present

## 2021-05-09 LAB — PULMONARY FUNCTION TEST
FEF 25-75 Pre: 3.88 L/sec
FEF2575-%Pred-Pre: 136 %
FEV1-%Pred-Pre: 100 %
FEV1-Pre: 3.06 L
FEV1FVC-%Pred-Pre: 105 %
FEV6-Pre: 3.59 L
FVC-%Pred-Pre: 95 %
FVC-Pre: 3.59 L
Pre FEV1/FVC ratio: 85 %
Pre FEV6/FVC Ratio: 100 %

## 2021-05-09 LAB — POCT EXHALED NITRIC OXIDE: FeNO level (ppb): 12

## 2021-05-09 NOTE — Progress Notes (Signed)
Spirometry/Feno performed today. 

## 2021-05-09 NOTE — Patient Instructions (Signed)
Spirometry/Feno performed ?

## 2021-05-09 NOTE — Progress Notes (Signed)
? ?      ?Gary Silva    [id number]    1966/01/17 ? ?Primary Care Physician:Wong, Maryla Morrow, MD ?Date of Appointment: 05/09/2021 ?Established Patient Visit ? ?Chief complaint:   ?Chief Complaint  ?Patient presents with  ? Follow-up  ?  F/U after PFT. States his breathing has improved since last visit. Switched to Xyzal from Zyrtec but plans on switching back to Zyrtec soon.   ? ? ? ?HPI: ?Gary Silva is a 56 y.o. man with history of previous tobacco use who presented with shortness of breath.  ? ?Interval Updates: ?Here for follow up after PFTs. His cough and shortness of breath have improved.  ?He is off prednisone.  ? ?He is taking symbicort prn since last time I saw him, usually about once a day. Starting taking xyzal but didn't work as well. Wants to switch back to zyrtec. Trying to treat itchy eyes, stuffy nose. Taking flonase nasal spray and saline solution.  ? ? ?PFTs show normal pulmonary function Feno was 13 ppb.  ? ? ?I have reviewed the patient's family social and past medical history and updated as appropriate.  ? ?Past Medical History:  ?Diagnosis Date  ? Hypercholesteremia   ? ? ?Past Surgical History:  ?Procedure Laterality Date  ? NASAL SEPTUM SURGERY    ? ? ?Family History  ?Problem Relation Age of Onset  ? Asthma Neg Hx   ? ? ?Social History  ? ?Occupational History  ? Not on file  ?Tobacco Use  ? Smoking status: Former  ?  Packs/day: 2.00  ?  Years: 10.00  ?  Pack years: 20.00  ?  Types: Cigarettes  ?  Quit date: 2003  ?  Years since quitting: 20.3  ? Smokeless tobacco: Never  ?Substance and Sexual Activity  ? Alcohol use: Yes  ?  Comment: Once a month  ? Drug use: Never  ? Sexual activity: Not on file  ? ? ? ?Physical Exam: ?Blood pressure 128/82, pulse 88, height 5\' 5"  (1.651 m), weight 169 lb (76.7 kg), SpO2 96 %. ? ?Gen:      No acute distress ?ENT:  swollen nasal turbinades, no polyps ?Lungs:    No increased respiratory effort, symmetric chest wall excursion, clear to auscultation  bilaterally, no wheezes or crackles ?CV:         Regular rate and rhythm; no murmurs, rubs, or gallops.  No pedal edema ? ? ?Data Reviewed: ?Imaging: ? ? ?PFTs: ? ? ?  Latest Ref Rng & Units 05/09/2021  ?  8:50 AM  ?PFT Results  ?FVC-Pre L 3.59  P  ?FVC-Predicted Pre % 95  P  ?Pre FEV1/FVC % % 85  P  ?FEV1-Pre L 3.06  P  ?FEV1-Predicted Pre % 100  P  ?  ?P Preliminary result  ? ?I have personally reviewed the patient's PFTs and normal spirometry.  ? ?Labs: ? ?Immunization status: ?Immunization History  ?Administered Date(s) Administered  ? Influenza Split 11/16/2017  ? Influenza,inj,quad, With Preservative 12/23/2014  ? Tdap 12/22/2013  ? ? ?External Records Personally Reviewed: ENT, PCP ? ?Assessment:  ?Mild intermittent asthma ?Nasal congestion/allergic rhinitis ? ?Plan/Recommendations: ?Continue flonase, modified technique.  ?Asthma symptoms improved. Continue prn ICS-LABA with symbicort. He will let me know if he is needing it more than twice a day.  ? ?Ok to resume neti pot with boiled/distilled water. ? ?Return to Care: ?Return in about 6 months (around 11/08/2021). ? ? ?11/10/2021, MD ?Pulmonary and Critical Care Medicine ?  [facility] ?[phone] ? ? ? ? ? ?

## 2021-05-09 NOTE — Patient Instructions (Addendum)
Please schedule follow up scheduled with myself in 6 months.  If my schedule is not open yet, we will contact you with a reminder closer to that time. Please call [phone] if you haven't heard from [place] a month before.  ? ?You can try Pataday or Zatidor eyedrops for itchy eyes related to allergies. ? ?Ok to use neti pot - just use boiled or distilled water.  ? ?Continue symbicort as needed - but not more than twice a day. Call me if you are feeling like you need it more, or if you might need steroids again due to breathing.  ? ? ? ?

## 2022-03-31 IMAGING — CT CT ANGIO CHEST
3 of 9 series · 18 of 36 positions shown · IV contrast (Omnipaque)
Comparison: None.

CLINICAL DATA: Chest pain, cough, fever and positive PCR for
L1MTL-N7.

EXAM:
CT ANGIOGRAPHY CHEST WITH CONTRAST
TECHNIQUE: Multidetector CT imaging of the chest was performed using the
standard protocol during bolus administration of intravenous
contrast. Multiplanar CT image reconstructions and MIPs were
obtained to evaluate the vascular anatomy.
CONTRAST:  87mL OMNIPAQUE IOHEXOL 350 MG/ML SOLN

[Series 5: pe thins · axial · 0.80mm/px · z∈[-320,-47]mm · 14 of 317 slices shown]
[im 22/317  lung]
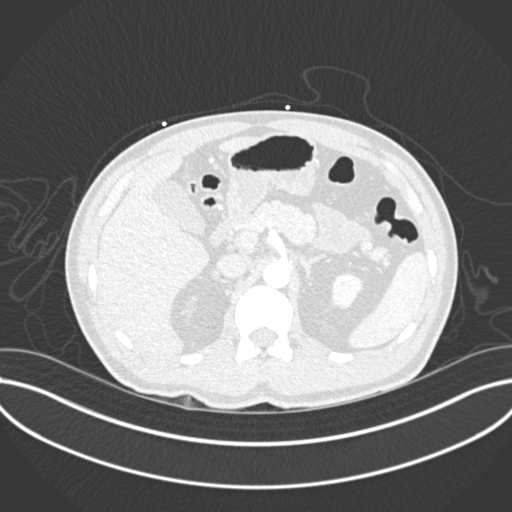
[im 43/317  mediastinal]
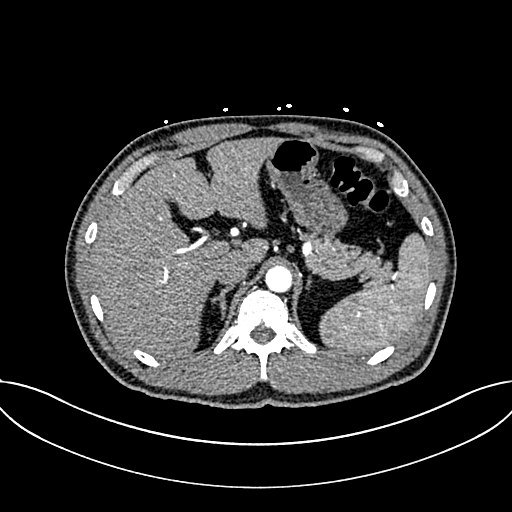
[im 64/317  lung]
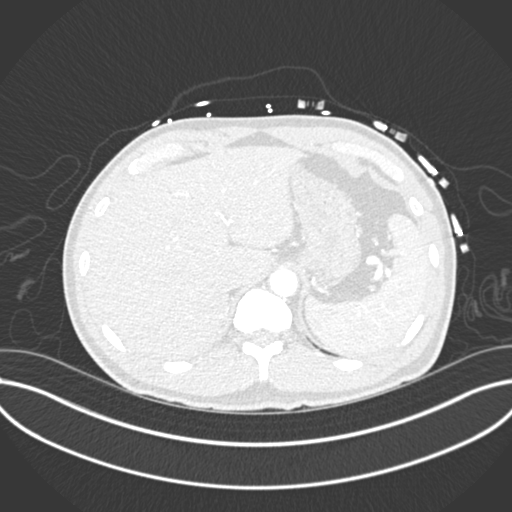
[im 85/317  mediastinal]
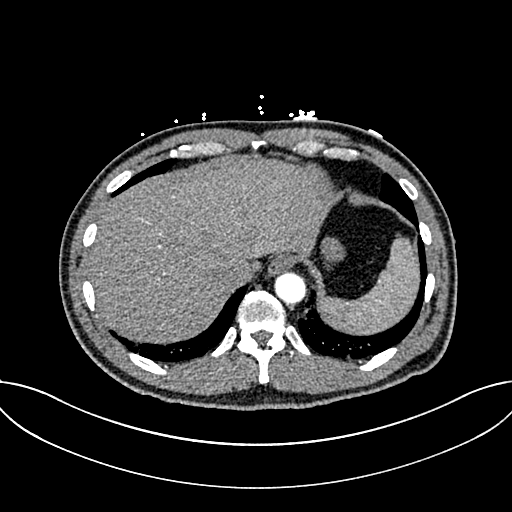
[im 106/317  lung]
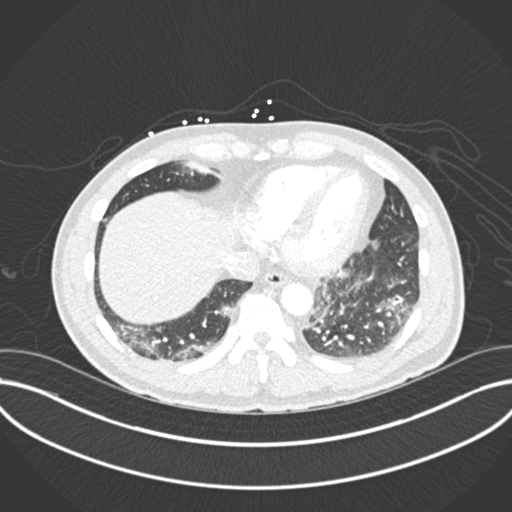
[im 127/317  mediastinal]
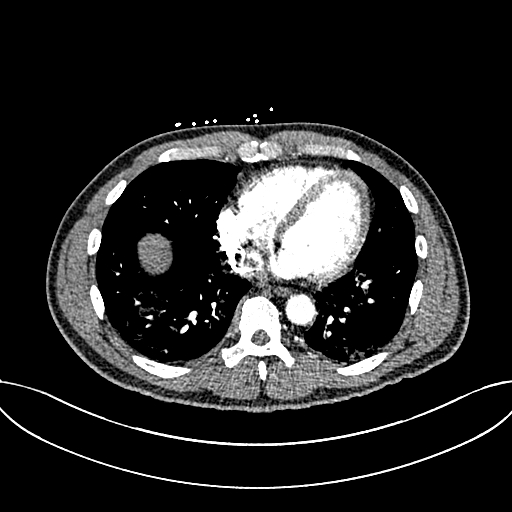
[im 148/317  lung]
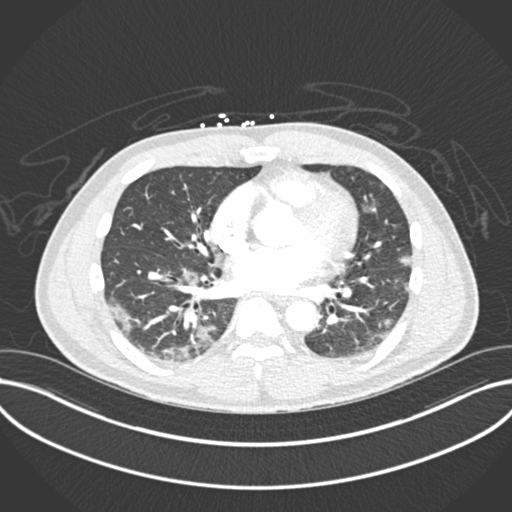
[im 169/317  mediastinal]
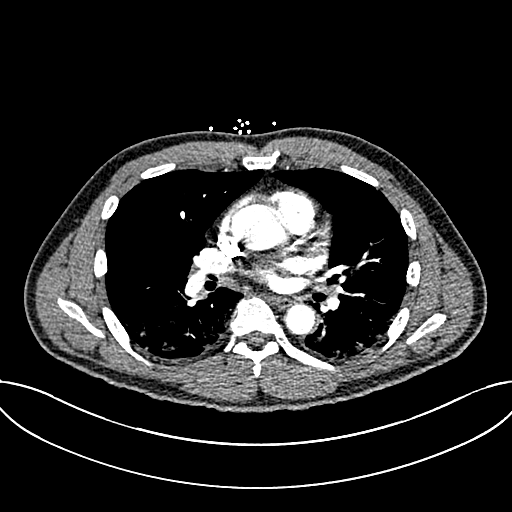
[im 190/317  lung]
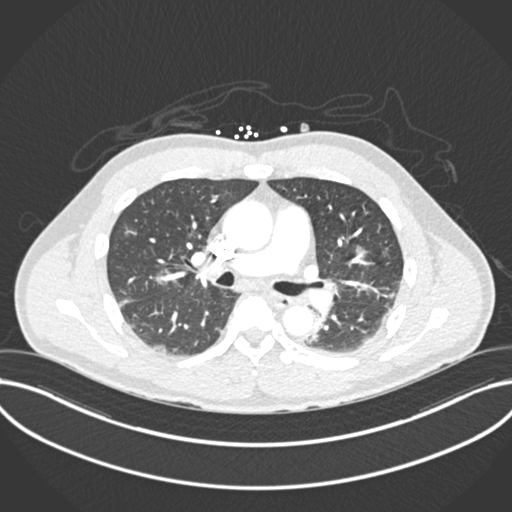
[im 211/317  mediastinal]
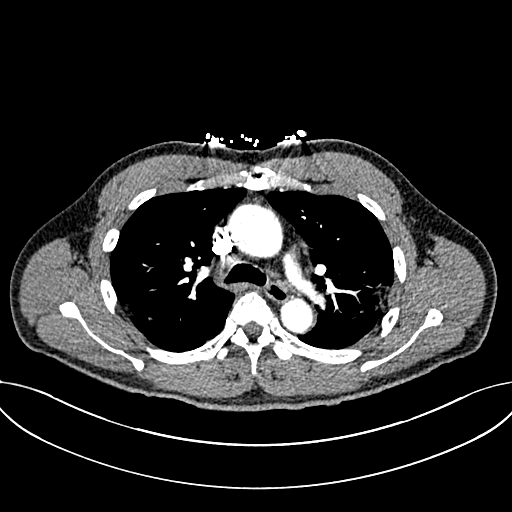
[im 232/317  lung]
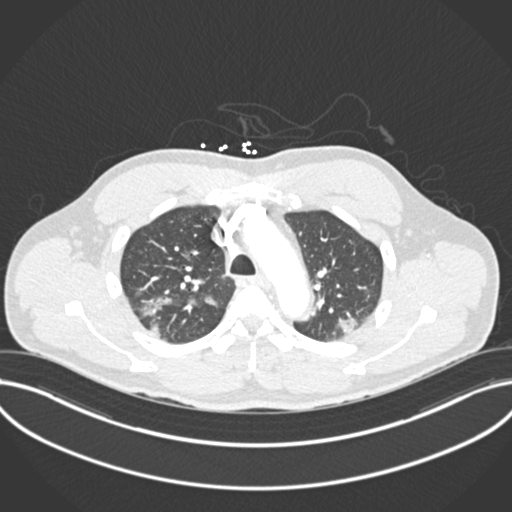
[im 253/317  mediastinal]
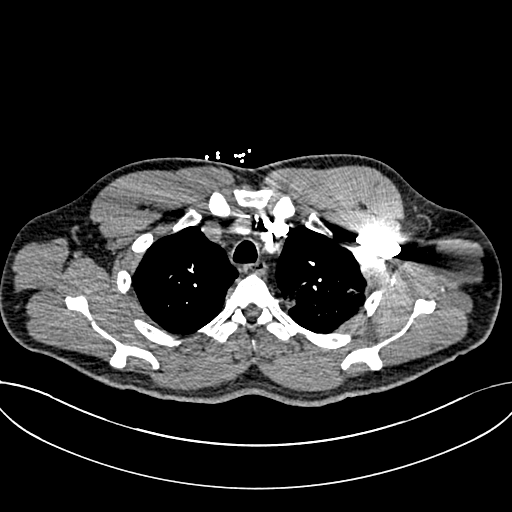
[im 274/317  lung]
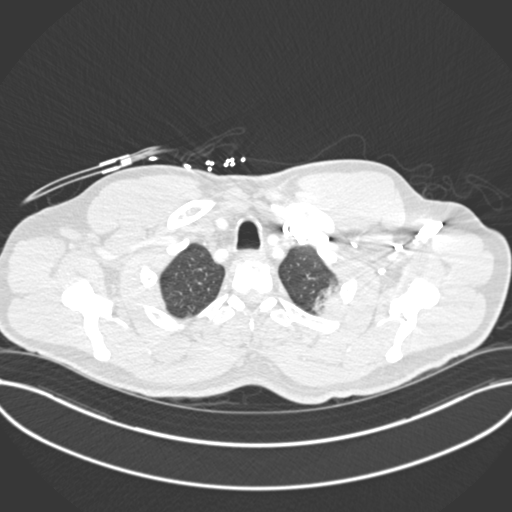
[im 295/317  mediastinal]
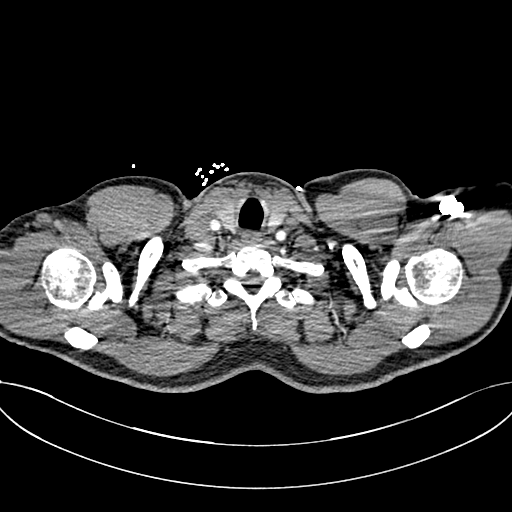

[Series 6: pe lung · axial · 0.94mm/px · z∈[-211,-88]mm · 3 of 83 slices shown]
[im 21/83  mediastinal]
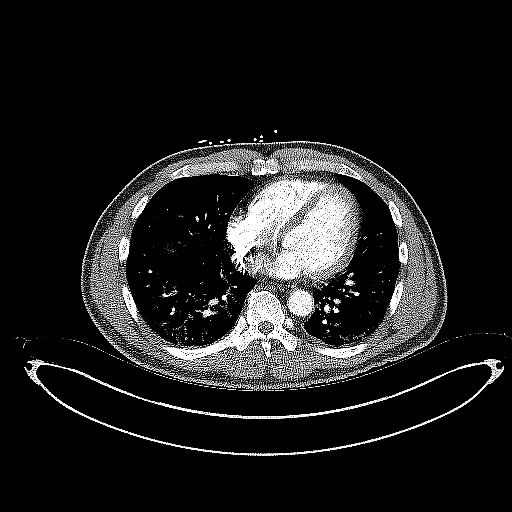
[im 42/83  mediastinal]
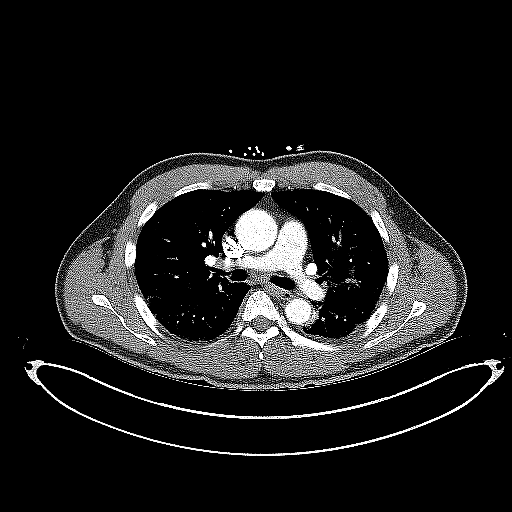
[im 62/83  mediastinal]
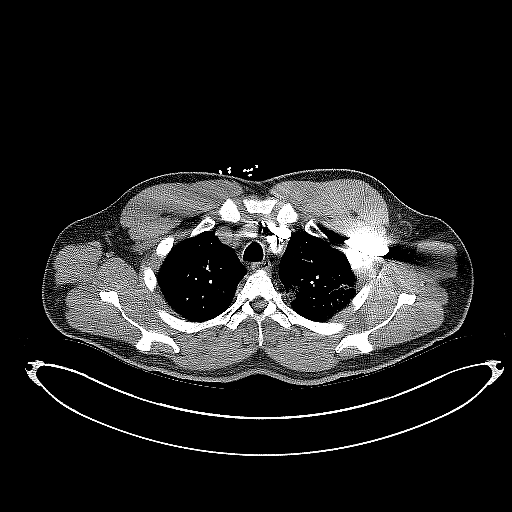

[Series 7: pe coronal mpr · coronal · 0.62mm/px · 1 of 124 slices shown]
[im 62/124  mediastinal]
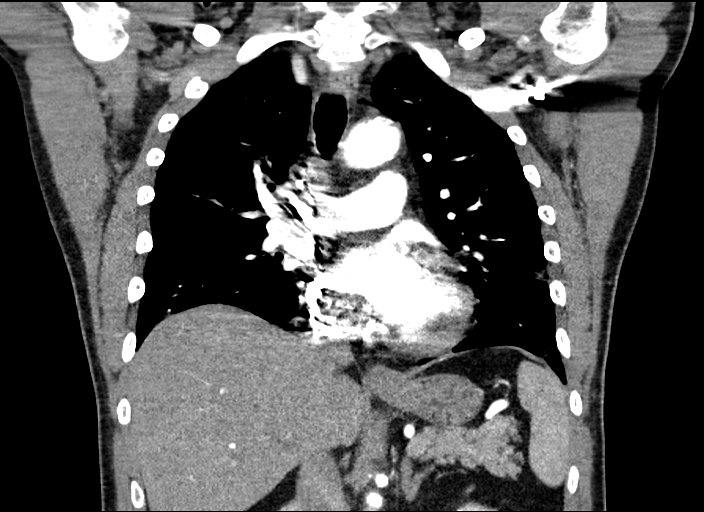

[18 of 36 positions shown; findings below may reference images not displayed]

FINDINGS: Cardiovascular: Satisfactory opacification of the pulmonary arteries
to the segmental level. No evidence of pulmonary embolism. Normal
caliber central pulmonary arteries. The thoracic aorta is also well
opacified and demonstrates no evidence aneurysmal disease or
dissection. There is mild atherosclerotic plaque at the level of the
aortic arch and descending thoracic aorta. Normal heart size. No
pericardial effusion.

Mediastinum/Nodes: No enlarged mediastinal, hilar, or axillary lymph
nodes. Thyroid gland, trachea, and esophagus demonstrate no
significant findings.

Lungs/Pleura: Diffuse pattern of multifocal peripheral ground-glass
and interstitial opacities throughout all lobes of both lungs
consistent with bilateral L1MTL-N7 pneumonia. No associated
pneumothorax, pulmonary edema or pleural fluid identified.

Upper Abdomen: No acute abnormality.

Musculoskeletal: No chest wall abnormality. No acute or significant
osseous findings.

Review of the MIP images confirms the above findings.
IMPRESSION: 1. No evidence of pulmonary embolism.
2. Diffuse pattern of multifocal peripheral ground-glass and
interstitial opacities throughout all lobes of both lungs consistent
with bilateral L1MTL-N7 pneumonia.
3. Mild aortic atherosclerosis.

Aortic Atherosclerosis ([id number]).

## 2022-11-29 DIAGNOSIS — J302 Other seasonal allergic rhinitis: Secondary | ICD-10-CM | POA: Diagnosis not present

## 2022-11-29 DIAGNOSIS — J4520 Mild intermittent asthma, uncomplicated: Secondary | ICD-10-CM | POA: Diagnosis not present

## 2022-11-29 DIAGNOSIS — J301 Allergic rhinitis due to pollen: Secondary | ICD-10-CM | POA: Diagnosis not present

## 2023-02-01 DIAGNOSIS — M545 Low back pain, unspecified: Secondary | ICD-10-CM | POA: Diagnosis not present

## 2023-02-07 DIAGNOSIS — Z0000 Encounter for general adult medical examination without abnormal findings: Secondary | ICD-10-CM | POA: Diagnosis not present

## 2023-02-07 DIAGNOSIS — Z13220 Encounter for screening for lipoid disorders: Secondary | ICD-10-CM | POA: Diagnosis not present

## 2023-02-07 DIAGNOSIS — R7303 Prediabetes: Secondary | ICD-10-CM | POA: Diagnosis not present

## 2023-02-07 DIAGNOSIS — Z125 Encounter for screening for malignant neoplasm of prostate: Secondary | ICD-10-CM | POA: Diagnosis not present

## 2023-02-07 DIAGNOSIS — J4520 Mild intermittent asthma, uncomplicated: Secondary | ICD-10-CM | POA: Diagnosis not present

## 2023-02-07 DIAGNOSIS — Z1389 Encounter for screening for other disorder: Secondary | ICD-10-CM | POA: Diagnosis not present

## 2023-02-07 DIAGNOSIS — M62830 Muscle spasm of back: Secondary | ICD-10-CM | POA: Diagnosis not present

## 2023-06-04 DIAGNOSIS — E782 Mixed hyperlipidemia: Secondary | ICD-10-CM | POA: Diagnosis not present
